# Patient Record
Sex: Female | Born: 1950 | Race: White | Hispanic: Yes | Marital: Married | Smoking: Never smoker
Health system: Southern US, Community
[De-identification: ages and names within clinical notes are randomized; demographics above are authoritative.]

---

## 1998-06-04 ENCOUNTER — Encounter: Admission: RE | Admit: 1998-06-04 | Discharge: 1998-06-04 | Payer: Self-pay | Admitting: Internal Medicine

## 1998-06-18 ENCOUNTER — Encounter: Admission: RE | Admit: 1998-06-18 | Discharge: 1998-06-18 | Payer: Self-pay | Admitting: Internal Medicine

## 1998-07-30 ENCOUNTER — Encounter: Admission: RE | Admit: 1998-07-30 | Discharge: 1998-07-30 | Payer: Self-pay | Admitting: Internal Medicine

## 1998-11-28 ENCOUNTER — Encounter: Payer: Self-pay | Admitting: Emergency Medicine

## 1998-11-28 ENCOUNTER — Encounter: Payer: Self-pay | Admitting: Internal Medicine

## 1998-11-28 ENCOUNTER — Inpatient Hospital Stay (HOSPITAL_COMMUNITY): Admission: EM | Admit: 1998-11-28 | Discharge: 1998-11-29 | Payer: Self-pay | Admitting: Emergency Medicine

## 1999-03-14 ENCOUNTER — Encounter: Admission: RE | Admit: 1999-03-14 | Discharge: 1999-03-14 | Payer: Self-pay | Admitting: Internal Medicine

## 1999-04-10 ENCOUNTER — Emergency Department (HOSPITAL_COMMUNITY): Admission: EM | Admit: 1999-04-10 | Discharge: 1999-04-10 | Payer: Self-pay | Admitting: Emergency Medicine

## 1999-10-11 ENCOUNTER — Emergency Department (HOSPITAL_COMMUNITY): Admission: EM | Admit: 1999-10-11 | Discharge: 1999-10-11 | Payer: Self-pay | Admitting: Emergency Medicine

## 1999-10-11 ENCOUNTER — Encounter: Payer: Self-pay | Admitting: Emergency Medicine

## 1999-10-25 ENCOUNTER — Encounter: Admission: RE | Admit: 1999-10-25 | Discharge: 1999-10-25 | Payer: Self-pay | Admitting: Internal Medicine

## 1999-11-15 ENCOUNTER — Encounter: Admission: RE | Admit: 1999-11-15 | Discharge: 1999-11-15 | Payer: Self-pay | Admitting: Internal Medicine

## 1999-12-18 ENCOUNTER — Encounter: Admission: RE | Admit: 1999-12-18 | Discharge: 1999-12-18 | Payer: Self-pay | Admitting: Internal Medicine

## 1999-12-25 ENCOUNTER — Ambulatory Visit (HOSPITAL_COMMUNITY): Admission: RE | Admit: 1999-12-25 | Discharge: 1999-12-25 | Payer: Self-pay

## 1999-12-25 ENCOUNTER — Encounter: Payer: Self-pay | Admitting: Internal Medicine

## 2000-01-14 ENCOUNTER — Ambulatory Visit (HOSPITAL_COMMUNITY): Admission: RE | Admit: 2000-01-14 | Discharge: 2000-01-14 | Payer: Self-pay | Admitting: Internal Medicine

## 2001-11-15 ENCOUNTER — Encounter: Admission: RE | Admit: 2001-11-15 | Discharge: 2001-11-15 | Payer: Self-pay | Admitting: Internal Medicine

## 2002-09-22 ENCOUNTER — Encounter: Admission: RE | Admit: 2002-09-22 | Discharge: 2002-09-22 | Payer: Self-pay | Admitting: Obstetrics and Gynecology

## 2003-05-04 ENCOUNTER — Encounter: Admission: RE | Admit: 2003-05-04 | Discharge: 2003-05-04 | Payer: Self-pay | Admitting: Internal Medicine

## 2006-07-29 ENCOUNTER — Ambulatory Visit: Payer: Self-pay | Admitting: Family Medicine

## 2006-07-30 ENCOUNTER — Ambulatory Visit: Payer: Self-pay | Admitting: *Deleted

## 2006-08-17 ENCOUNTER — Ambulatory Visit: Payer: Self-pay | Admitting: Family Medicine

## 2006-08-21 ENCOUNTER — Ambulatory Visit (HOSPITAL_COMMUNITY): Admission: RE | Admit: 2006-08-21 | Discharge: 2006-08-21 | Payer: Self-pay | Admitting: Family Medicine

## 2006-12-10 ENCOUNTER — Ambulatory Visit: Payer: Self-pay | Admitting: Internal Medicine

## 2006-12-10 ENCOUNTER — Encounter (INDEPENDENT_AMBULATORY_CARE_PROVIDER_SITE_OTHER): Payer: Self-pay | Admitting: Nurse Practitioner

## 2006-12-10 LAB — CONVERTED CEMR LAB
ALT: 39 units/L — ABNORMAL HIGH (ref 0–35)
Albumin: 4.3 g/dL (ref 3.5–5.2)
BUN: 12 mg/dL (ref 6–23)
CO2: 25 meq/L (ref 19–32)
Chloride: 109 meq/L (ref 96–112)
Cholesterol: 209 mg/dL — ABNORMAL HIGH (ref 0–200)
Glucose, Bld: 112 mg/dL — ABNORMAL HIGH (ref 70–99)
LDL Cholesterol: 123 mg/dL — ABNORMAL HIGH (ref 0–99)
Potassium: 5 meq/L (ref 3.5–5.3)
Total Bilirubin: 0.7 mg/dL (ref 0.3–1.2)
VLDL: 44 mg/dL — ABNORMAL HIGH (ref 0–40)

## 2007-06-04 ENCOUNTER — Ambulatory Visit: Payer: Self-pay | Admitting: Internal Medicine

## 2007-06-04 ENCOUNTER — Encounter (INDEPENDENT_AMBULATORY_CARE_PROVIDER_SITE_OTHER): Payer: Self-pay | Admitting: Nurse Practitioner

## 2007-06-04 LAB — CONVERTED CEMR LAB
AST: 39 units/L — ABNORMAL HIGH (ref 0–37)
Alkaline Phosphatase: 82 units/L (ref 39–117)
BUN: 13 mg/dL (ref 6–23)
Basophils Absolute: 0.1 10*3/uL (ref 0.0–0.1)
Calcium: 9.4 mg/dL (ref 8.4–10.5)
Eosinophils Absolute: 1.8 10*3/uL — ABNORMAL HIGH (ref 0.0–0.7)
Eosinophils Relative: 22 % — ABNORMAL HIGH (ref 0–5)
Glucose, Bld: 128 mg/dL — ABNORMAL HIGH (ref 70–99)
HCT: 46.2 % — ABNORMAL HIGH (ref 36.0–46.0)
Hemoglobin: 15.1 g/dL — ABNORMAL HIGH (ref 12.0–15.0)
MCHC: 32.7 g/dL (ref 30.0–36.0)
Monocytes Absolute: 0.6 10*3/uL (ref 0.1–1.0)
Neutro Abs: 3 10*3/uL (ref 1.7–7.7)
RBC: 5.05 M/uL (ref 3.87–5.11)
Sodium: 140 meq/L (ref 135–145)
Total Bilirubin: 0.3 mg/dL (ref 0.3–1.2)
WBC: 8.5 10*3/uL (ref 4.0–10.5)

## 2007-06-07 ENCOUNTER — Encounter (INDEPENDENT_AMBULATORY_CARE_PROVIDER_SITE_OTHER): Payer: Self-pay | Admitting: Nurse Practitioner

## 2007-06-07 LAB — CONVERTED CEMR LAB
Hep B Core Total Ab: POSITIVE — AB
Hep B S Ab: POSITIVE — AB

## 2007-06-24 ENCOUNTER — Encounter (INDEPENDENT_AMBULATORY_CARE_PROVIDER_SITE_OTHER): Payer: Self-pay | Admitting: Nurse Practitioner

## 2007-06-24 ENCOUNTER — Ambulatory Visit: Payer: Self-pay | Admitting: Internal Medicine

## 2007-06-24 LAB — CONVERTED CEMR LAB
BUN: 10 mg/dL (ref 6–23)
Chloride: 105 meq/L (ref 96–112)
Cholesterol: 217 mg/dL — ABNORMAL HIGH (ref 0–200)
Creatinine, Ser: 0.7 mg/dL (ref 0.40–1.20)
Glucose, Bld: 120 mg/dL — ABNORMAL HIGH (ref 70–99)
HDL: 43 mg/dL (ref >39)
LDL Cholesterol: 138 mg/dL — ABNORMAL HIGH (ref 0–99)
Potassium: 4.3 meq/L (ref 3.5–5.3)
Sodium: 141 meq/L (ref 135–145)
Total Bilirubin: 0.5 mg/dL (ref 0.3–1.2)
Triglycerides: 181 mg/dL — ABNORMAL HIGH (ref <150)
VLDL: 36 mg/dL (ref 0–40)

## 2007-10-06 ENCOUNTER — Encounter: Payer: Self-pay | Admitting: Family Medicine

## 2007-10-06 ENCOUNTER — Ambulatory Visit: Payer: Self-pay | Admitting: Family Medicine

## 2008-01-12 ENCOUNTER — Ambulatory Visit: Payer: Self-pay | Admitting: Internal Medicine

## 2008-01-20 ENCOUNTER — Ambulatory Visit: Payer: Self-pay | Admitting: Internal Medicine

## 2008-01-21 ENCOUNTER — Ambulatory Visit (HOSPITAL_COMMUNITY): Admission: RE | Admit: 2008-01-21 | Discharge: 2008-01-21 | Payer: Self-pay | Admitting: Family Medicine

## 2008-01-24 ENCOUNTER — Ambulatory Visit: Payer: Self-pay | Admitting: Family Medicine

## 2008-01-24 LAB — CONVERTED CEMR LAB
ALT: 48 units/L — ABNORMAL HIGH (ref 0–35)
Albumin: 4.2 g/dL (ref 3.5–5.2)
Basophils Relative: 1 % (ref 0–1)
CO2: 23 meq/L (ref 19–32)
Chloride: 105 meq/L (ref 96–112)
Cholesterol: 124 mg/dL (ref 0–200)
Creatinine, Ser: 0.65 mg/dL (ref 0.40–1.20)
Eosinophils Absolute: 1.3 10*3/uL — ABNORMAL HIGH (ref 0.0–0.7)
Potassium: 4.6 meq/L (ref 3.5–5.3)
RDW: 13.4 % (ref 11.5–15.5)
Sodium: 144 meq/L (ref 135–145)
Total Bilirubin: 0.8 mg/dL (ref 0.3–1.2)
Total Protein: 6.7 g/dL (ref 6.0–8.3)
Triglycerides: 96 mg/dL (ref <150)
VLDL: 19 mg/dL (ref 0–40)

## 2008-05-31 ENCOUNTER — Ambulatory Visit: Payer: Self-pay | Admitting: Internal Medicine

## 2008-11-15 ENCOUNTER — Encounter: Payer: Self-pay | Admitting: Family Medicine

## 2008-11-15 ENCOUNTER — Ambulatory Visit: Payer: Self-pay | Admitting: Internal Medicine

## 2008-12-20 ENCOUNTER — Encounter: Admission: RE | Admit: 2008-12-20 | Discharge: 2009-03-20 | Payer: Self-pay | Admitting: Unknown Physician Specialty

## 2008-12-21 ENCOUNTER — Ambulatory Visit: Payer: Self-pay | Admitting: Internal Medicine

## 2008-12-28 ENCOUNTER — Ambulatory Visit: Payer: Self-pay | Admitting: Internal Medicine

## 2008-12-29 LAB — CONVERTED CEMR LAB
ALT: 25 units/L (ref 0–35)
AST: 20 units/L (ref 0–37)
Albumin: 4.6 g/dL (ref 3.5–5.2)
BUN: 13 mg/dL (ref 6–23)
CO2: 23 meq/L (ref 19–32)
Calcium: 9.7 mg/dL (ref 8.4–10.5)
Chloride: 102 meq/L (ref 96–112)
Creatinine, Ser: 0.72 mg/dL (ref 0.40–1.20)
Helicobacter Pylori Antibody-IgG: 2.8 — ABNORMAL HIGH
Potassium: 4.5 meq/L (ref 3.5–5.3)
Sodium: 140 meq/L (ref 135–145)

## 2009-01-01 ENCOUNTER — Ambulatory Visit (HOSPITAL_COMMUNITY): Admission: RE | Admit: 2009-01-01 | Discharge: 2009-01-01 | Payer: Self-pay | Admitting: Internal Medicine

## 2009-01-02 ENCOUNTER — Telehealth: Payer: Self-pay | Admitting: Internal Medicine

## 2009-01-11 ENCOUNTER — Encounter: Payer: Self-pay | Admitting: Family Medicine

## 2009-01-11 ENCOUNTER — Ambulatory Visit: Payer: Self-pay | Admitting: Internal Medicine

## 2009-01-11 LAB — CONVERTED CEMR LAB
AST: 15 units/L (ref 0–37)
Albumin: 4.3 g/dL (ref 3.5–5.2)
BUN: 10 mg/dL (ref 6–23)
Basophils Absolute: 0 10*3/uL (ref 0.0–0.1)
Chloride: 103 meq/L (ref 96–112)
Cholesterol: 124 mg/dL (ref 0–200)
Eosinophils Relative: 18 % — ABNORMAL HIGH (ref 0–5)
HCT: 45.3 % (ref 36.0–46.0)
HDL: 44 mg/dL (ref >39)
Hemoglobin: 14.1 g/dL (ref 12.0–15.0)
LDL Cholesterol: 60 mg/dL (ref 0–99)
Monocytes Absolute: 0.5 10*3/uL (ref 0.1–1.0)
Platelets: 292 10*3/uL (ref 150–400)
Potassium: 4.6 meq/L (ref 3.5–5.3)
RBC: 4.82 M/uL (ref 3.87–5.11)
Sodium: 141 meq/L (ref 135–145)
Total Bilirubin: 0.6 mg/dL (ref 0.3–1.2)
Total CHOL/HDL Ratio: 2.8
Triglycerides: 99 mg/dL (ref <150)
VLDL: 20 mg/dL (ref 0–40)
WBC: 7.6 10*3/uL (ref 4.0–10.5)

## 2009-02-05 ENCOUNTER — Ambulatory Visit: Payer: Self-pay | Admitting: Internal Medicine

## 2009-05-01 ENCOUNTER — Encounter: Admission: RE | Admit: 2009-05-01 | Discharge: 2009-06-15 | Payer: Self-pay | Admitting: Orthopedic Surgery

## 2009-05-28 ENCOUNTER — Encounter (INDEPENDENT_AMBULATORY_CARE_PROVIDER_SITE_OTHER): Payer: Self-pay | Admitting: Internal Medicine

## 2009-05-28 ENCOUNTER — Other Ambulatory Visit: Admission: RE | Admit: 2009-05-28 | Discharge: 2009-05-28 | Payer: Self-pay | Admitting: Internal Medicine

## 2009-05-28 ENCOUNTER — Ambulatory Visit: Payer: Self-pay | Admitting: Family Medicine

## 2009-06-05 ENCOUNTER — Ambulatory Visit (HOSPITAL_COMMUNITY): Admission: RE | Admit: 2009-06-05 | Discharge: 2009-06-05 | Payer: Self-pay | Admitting: Internal Medicine

## 2009-06-29 ENCOUNTER — Emergency Department (HOSPITAL_COMMUNITY): Admission: EM | Admit: 2009-06-29 | Discharge: 2009-06-30 | Payer: Self-pay | Admitting: Emergency Medicine

## 2009-08-20 ENCOUNTER — Ambulatory Visit: Payer: Self-pay | Admitting: Internal Medicine

## 2009-08-20 LAB — CONVERTED CEMR LAB
BUN: 14 mg/dL (ref 6–23)
CO2: 23 meq/L (ref 19–32)
Chloride: 103 meq/L (ref 96–112)
Cholesterol: 212 mg/dL — ABNORMAL HIGH (ref 0–200)
Creatinine, Ser: 0.61 mg/dL (ref 0.40–1.20)
HDL: 40 mg/dL (ref >39)
LDL Cholesterol: 118 mg/dL — ABNORMAL HIGH (ref 0–99)
Potassium: 4.5 meq/L (ref 3.5–5.3)
VLDL: 54 mg/dL — ABNORMAL HIGH (ref 0–40)

## 2009-08-27 ENCOUNTER — Ambulatory Visit: Payer: Self-pay | Admitting: Internal Medicine

## 2009-10-31 ENCOUNTER — Ambulatory Visit: Payer: Self-pay | Admitting: Internal Medicine

## 2009-12-14 ENCOUNTER — Ambulatory Visit: Payer: Self-pay | Admitting: Internal Medicine

## 2009-12-14 LAB — CONVERTED CEMR LAB
Hgb A1c MFr Bld: 8.8 % — ABNORMAL HIGH (ref <5.7)
LDL Cholesterol: 70 mg/dL (ref 0–99)
Total CHOL/HDL Ratio: 3.3
Triglycerides: 128 mg/dL (ref <150)
VLDL: 26 mg/dL (ref 0–40)

## 2010-06-20 ENCOUNTER — Encounter: Payer: Self-pay | Admitting: Internal Medicine

## 2010-06-20 LAB — CONVERTED CEMR LAB
ALT: 68 units/L — ABNORMAL HIGH (ref 0–35)
AST: 52 units/L — ABNORMAL HIGH (ref 0–37)
BUN: 12 mg/dL (ref 6–23)
Calcium: 10 mg/dL (ref 8.4–10.5)
Chloride: 100 meq/L (ref 96–112)
Microalb, Ur: 0.5 mg/dL (ref 0.00–1.89)
Sodium: 139 meq/L (ref 135–145)
Total CHOL/HDL Ratio: 4.5
Total Protein: 7.4 g/dL (ref 6.0–8.3)

## 2010-07-08 LAB — CBC
HCT: 39.8 % (ref 36.0–46.0)
MCHC: 34.4 g/dL (ref 30.0–36.0)
MCV: 92.6 fL (ref 78.0–100.0)
Platelets: 198 10*3/uL (ref 150–400)
RDW: 13 % (ref 11.5–15.5)
WBC: 8.4 10*3/uL (ref 4.0–10.5)

## 2010-07-08 LAB — POCT I-STAT, CHEM 8
Chloride: 110 mEq/L (ref 96–112)
Potassium: 4.4 mEq/L (ref 3.5–5.1)
Sodium: 138 mEq/L (ref 135–145)

## 2010-07-08 LAB — DIFFERENTIAL
Basophils Relative: 1 % (ref 0–1)
Eosinophils Absolute: 2 10*3/uL — ABNORMAL HIGH (ref 0.0–0.7)
Lymphocytes Relative: 34 % (ref 12–46)

## 2010-07-08 LAB — BASIC METABOLIC PANEL
Calcium: 9.5 mg/dL (ref 8.4–10.5)
Chloride: 107 mEq/L (ref 96–112)
Creatinine, Ser: 0.62 mg/dL (ref 0.4–1.2)
GFR calc Af Amer: >60 mL/min (ref >60)

## 2010-07-08 LAB — POCT CARDIAC MARKERS: Troponin i, poc: <0.05 ng/mL (ref 0.00–0.09)

## 2010-08-30 ENCOUNTER — Other Ambulatory Visit (HOSPITAL_COMMUNITY): Payer: Self-pay | Admitting: Family Medicine

## 2010-08-30 DIAGNOSIS — Z1231 Encounter for screening mammogram for malignant neoplasm of breast: Secondary | ICD-10-CM

## 2010-09-12 ENCOUNTER — Ambulatory Visit (HOSPITAL_COMMUNITY)
Admission: RE | Admit: 2010-09-12 | Discharge: 2010-09-12 | Disposition: A | Discharge status: Home / Self Care | Payer: Medicaid Other | Source: Ambulatory Visit | Attending: Family Medicine | Admitting: Family Medicine

## 2010-09-12 DIAGNOSIS — Z1231 Encounter for screening mammogram for malignant neoplasm of breast: Secondary | ICD-10-CM | POA: Insufficient documentation

## 2011-09-05 ENCOUNTER — Other Ambulatory Visit (HOSPITAL_COMMUNITY): Payer: Self-pay | Admitting: Family Medicine

## 2011-09-05 DIAGNOSIS — Z1231 Encounter for screening mammogram for malignant neoplasm of breast: Secondary | ICD-10-CM

## 2011-10-22 ENCOUNTER — Ambulatory Visit (HOSPITAL_COMMUNITY)
Admission: RE | Admit: 2011-10-22 | Discharge: 2011-10-22 | Disposition: A | Discharge status: Home / Self Care | Payer: Medicaid Other | Source: Ambulatory Visit | Attending: Family Medicine | Admitting: Family Medicine

## 2011-10-22 DIAGNOSIS — Z1231 Encounter for screening mammogram for malignant neoplasm of breast: Secondary | ICD-10-CM | POA: Insufficient documentation

## 2012-09-20 ENCOUNTER — Other Ambulatory Visit (HOSPITAL_COMMUNITY): Payer: Self-pay | Admitting: Specialist

## 2012-09-20 DIAGNOSIS — Z1231 Encounter for screening mammogram for malignant neoplasm of breast: Secondary | ICD-10-CM

## 2012-10-26 ENCOUNTER — Ambulatory Visit (HOSPITAL_COMMUNITY)
Admission: RE | Admit: 2012-10-26 | Discharge: 2012-10-26 | Disposition: A | Discharge status: Home / Self Care | Payer: Medicaid Other | Source: Ambulatory Visit | Attending: Specialist | Admitting: Specialist

## 2012-10-26 DIAGNOSIS — Z1231 Encounter for screening mammogram for malignant neoplasm of breast: Secondary | ICD-10-CM

## 2013-11-10 ENCOUNTER — Other Ambulatory Visit (HOSPITAL_COMMUNITY): Payer: Self-pay | Admitting: Specialist

## 2013-11-10 DIAGNOSIS — Z1231 Encounter for screening mammogram for malignant neoplasm of breast: Secondary | ICD-10-CM

## 2013-11-17 ENCOUNTER — Ambulatory Visit (HOSPITAL_COMMUNITY)
Admission: RE | Admit: 2013-11-17 | Discharge: 2013-11-17 | Disposition: A | Discharge status: Home / Self Care | Payer: Medicaid Other | Source: Ambulatory Visit | Attending: Specialist | Admitting: Specialist

## 2013-11-17 DIAGNOSIS — Z1231 Encounter for screening mammogram for malignant neoplasm of breast: Secondary | ICD-10-CM | POA: Diagnosis present

## 2013-12-20 ENCOUNTER — Emergency Department (HOSPITAL_COMMUNITY)
Admission: EM | Admit: 2013-12-20 | Discharge: 2013-12-20 | Disposition: A | Discharge status: Home / Self Care | Payer: Medicaid Other | Attending: Emergency Medicine | Admitting: Emergency Medicine

## 2013-12-20 ENCOUNTER — Emergency Department (HOSPITAL_COMMUNITY): Payer: Medicaid Other

## 2013-12-20 ENCOUNTER — Encounter (HOSPITAL_COMMUNITY): Payer: Self-pay | Admitting: Emergency Medicine

## 2013-12-20 DIAGNOSIS — S46909A Unspecified injury of unspecified muscle, fascia and tendon at shoulder and upper arm level, unspecified arm, initial encounter: Secondary | ICD-10-CM | POA: Diagnosis not present

## 2013-12-20 DIAGNOSIS — S8990XA Unspecified injury of unspecified lower leg, initial encounter: Secondary | ICD-10-CM | POA: Insufficient documentation

## 2013-12-20 DIAGNOSIS — Y9289 Other specified places as the place of occurrence of the external cause: Secondary | ICD-10-CM | POA: Diagnosis not present

## 2013-12-20 DIAGNOSIS — S99919A Unspecified injury of unspecified ankle, initial encounter: Secondary | ICD-10-CM | POA: Diagnosis not present

## 2013-12-20 DIAGNOSIS — W010XXA Fall on same level from slipping, tripping and stumbling without subsequent striking against object, initial encounter: Secondary | ICD-10-CM | POA: Insufficient documentation

## 2013-12-20 DIAGNOSIS — S199XXA Unspecified injury of neck, initial encounter: Secondary | ICD-10-CM

## 2013-12-20 DIAGNOSIS — S4980XA Other specified injuries of shoulder and upper arm, unspecified arm, initial encounter: Secondary | ICD-10-CM | POA: Insufficient documentation

## 2013-12-20 DIAGNOSIS — W19XXXA Unspecified fall, initial encounter: Secondary | ICD-10-CM

## 2013-12-20 DIAGNOSIS — S0993XA Unspecified injury of face, initial encounter: Secondary | ICD-10-CM | POA: Diagnosis not present

## 2013-12-20 DIAGNOSIS — Y93E1 Activity, personal bathing and showering: Secondary | ICD-10-CM | POA: Insufficient documentation

## 2013-12-20 DIAGNOSIS — S7010XA Contusion of unspecified thigh, initial encounter: Secondary | ICD-10-CM | POA: Insufficient documentation

## 2013-12-20 DIAGNOSIS — S99929A Unspecified injury of unspecified foot, initial encounter: Principal | ICD-10-CM

## 2013-12-20 DIAGNOSIS — S8991XA Unspecified injury of right lower leg, initial encounter: Secondary | ICD-10-CM

## 2013-12-20 DIAGNOSIS — S4992XA Unspecified injury of left shoulder and upper arm, initial encounter: Secondary | ICD-10-CM

## 2013-12-20 MED ORDER — TRAMADOL HCL 50 MG PO TABS: 50.0000 mg | ORAL_TABLET | Freq: Four times a day (QID) | ORAL | Status: DC | PRN

## 2013-12-20 NOTE — Discharge Instructions (Signed)
Take Tramadol for pain. Refer to attached documents for more information.   Tome Tramadol para Chief Technology Officer . Consulte los documentos adjuntos para ms informacin.

## 2013-12-20 NOTE — ED Provider Notes (Signed)
CSN: [id number]     Arrival date & time 12/20/13  0831 History  This chart was scribed for non-physician practitioner Emilia Beck, PA-C, working with Flint Melter, MD, by Yevette Edwards, ED Scribe. This patient was seen in room TR08C/TR08C and the patient's care was started at 9:06 AM.   First MD Initiated Contact with Patient 12/20/13 [phone]     Chief Complaint  Patient presents with  . Fall    The history is provided by the patient. A language interpreter was used ([place]).   HPI Comments: Gabrielle Craig is a 63 y.o. female who presents to the Emergency Department complaining of a mechanical fall which occurred two days ago when she slipped in the shower. Gabrielle Craig reports she hit her forehead against the tub, but she denies LOC. She also reports she hit her left lateral thigh which is associated with a circular bruise that is painful with pressure. Gabrielle Craig also complains of her pain to her right knee which is worsened when she straightens her leg. Additionally, she complains of pain to her right arm and shoulder. She denies abdominal pain. She does not take blood thinners.   No past medical history on file. No past surgical history on file. No family history on file. History  Substance Use Topics  . Smoking status: Never Smoker   . Smokeless tobacco: Not on file  . Alcohol Use: No   No OB history provided.  Review of Systems  Constitutional: Negative for fever and chills.  Musculoskeletal: Positive for myalgias.  Neurological: Negative for syncope.  All other systems reviewed and are negative. \  Allergies  Review of patient's allergies indicates no known allergies.  Home Medications   Prior to Admission medications   Not on File   Triage Vitals: BP 121/61  Pulse 83  Temp(Src) 97.9 F (36.6 C) (Oral)  Resp 18  SpO2 95%  Physical Exam  Nursing note and vitals reviewed. Constitutional: She is oriented to person, place, and time. She appears well-developed and  well-nourished. No distress.  HENT:  Head: Normocephalic and atraumatic.  Left anterior forehead tenderness to palpation without hematoma or skull deformity.   Eyes: Conjunctivae and EOM are normal.  Neck: Neck supple. No tracheal deviation present.  Cardiovascular: Normal rate.   Pulmonary/Chest: Effort normal. No respiratory distress. She has no wheezes. She has no rales. She exhibits no tenderness.  No anterior or posterior chest tenderness to palpation.   Abdominal: Soft. She exhibits no distension. There is no tenderness. There is no rebound.  Musculoskeletal: Normal range of motion.  No midline spine tenderness to palpation. No paraspinal tenderness to palpation. No bruising noted on back.   Right anterior knee tenderness to palpation without bruising or deformity. Mild generalized edema. Slightly limited extension of right knee due to pain.   Left posterior shoulder tenderness to palpation over acromion process. No obvious deformity. Full ROM.   Large hematoma of left lateral thigh near hip with tenderness to palpation.   Neurological: She is alert and oriented to person, place, and time.  Lower extremity strength and sensation equal and intact bilaterally.   Skin: Skin is warm and dry.  Large hematoma of left lateral thigh.   Psychiatric: She has a normal mood and affect. Her behavior is normal.    ED Course  Procedures (including critical care time)  DIAGNOSTIC STUDIES: Oxygen Saturation is 95% on room air, adequate by my interpretation.    COORDINATION OF CARE:  9:15 AM- Discussed  treatment plan with patient, and the patient agreed to the plan. The plan includes imaging and pain medication.   Labs Review Labs Reviewed - No data to display  Imaging Review Dg Hip Complete Left  12/20/2013   CLINICAL DATA:  Left hip pain following injury  EXAM: LEFT HIP - COMPLETE 2+ VIEW  COMPARISON:  None.  FINDINGS: Mild degenerative changes are noted of the hip joints bilaterally. No  acute fracture dislocation is seen. No gross soft tissue abnormality is noted.  IMPRESSION: Mild degenerative change without acute abnormality.   Electronically Signed   By: Alcide Clever M.D.   On: 12/20/2013 09:57   Ct Head Wo Contrast  12/20/2013   CLINICAL DATA:  Status post fall 2 days ago with a blow to the left forehead. Hematoma. Neck pain.  EXAM: CT HEAD WITHOUT CONTRAST  CT CERVICAL SPINE WITHOUT CONTRAST  TECHNIQUE: Multidetector CT imaging of the head and cervical spine was performed following the standard protocol without intravenous contrast. Multiplanar CT image reconstructions of the cervical spine were also generated.  COMPARISON:  None.  FINDINGS: CT HEAD FINDINGS  There is no evidence of acute intracranial abnormality including infarct, hemorrhage, mass lesion, mass effect, midline shift or abnormal extra-axial fluid collection. No hydrocephalus or pneumocephalus. The calvarium is intact. Mild mucosal thickening ethmoid air cells is noted.  CT CERVICAL SPINE FINDINGS  There is no fracture or malalignment of the cervical spine. Disc bulging and endplate spurring are identified C5-6 and C6-7. Lung apices are clear.  IMPRESSION: No acute finding head or cervical spine.   Electronically Signed   By: Drusilla Kanner M.D.   On: 12/20/2013 10:14   Ct Cervical Spine Wo Contrast  12/20/2013   CLINICAL DATA:  Status post fall 2 days ago with a blow to the left forehead. Hematoma. Neck pain.  EXAM: CT HEAD WITHOUT CONTRAST  CT CERVICAL SPINE WITHOUT CONTRAST  TECHNIQUE: Multidetector CT imaging of the head and cervical spine was performed following the standard protocol without intravenous contrast. Multiplanar CT image reconstructions of the cervical spine were also generated.  COMPARISON:  None.  FINDINGS: CT HEAD FINDINGS  There is no evidence of acute intracranial abnormality including infarct, hemorrhage, mass lesion, mass effect, midline shift or abnormal extra-axial fluid collection. No  hydrocephalus or pneumocephalus. The calvarium is intact. Mild mucosal thickening ethmoid air cells is noted.  CT CERVICAL SPINE FINDINGS  There is no fracture or malalignment of the cervical spine. Disc bulging and endplate spurring are identified C5-6 and C6-7. Lung apices are clear.  IMPRESSION: No acute finding head or cervical spine.   Electronically Signed   By: Drusilla Kanner M.D.   On: 12/20/2013 10:14   Dg Shoulder Left  12/20/2013   CLINICAL DATA:  Left shoulder pain  EXAM: LEFT SHOULDER - 2+ VIEW  COMPARISON:  None.  FINDINGS: There is no evidence of fracture or dislocation. There is no evidence of arthropathy or other focal bone abnormality. Soft tissues are unremarkable.  IMPRESSION: No acute abnormality noted.   Electronically Signed   By: Alcide Clever M.D.   On: 12/20/2013 09:56   Dg Knee Complete 4 Views Right  12/20/2013   CLINICAL DATA:  Trauma and pain.  EXAM: RIGHT KNEE - COMPLETE 4+ VIEW  COMPARISON:  None.  FINDINGS: Moderate medial and lateral compartment joint space narrowing and osteophyte formation. Small intra-articular loose body projecting adjacent the lateral tibial spine. No acute fracture or dislocation. Moderate to marked patellofemoral joint space narrowing  and osteophyte formation. No joint effusion.  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   Electronically Signed   By: Jeronimo Greaves M.D.   On: 12/20/2013 10:00     EKG Interpretation None      MDM   Final diagnoses:  Fall, initial encounter  Right knee injury, initial encounter  Shoulder injury, left, initial encounter    10:21 AM Patient's xrays and CT head unremarkable for acute changes. Patient will have tramadol for pain. Vitals stable and patient afebrile. No further evaluation needed at this time.   I personally performed the services described in this documentation, which was scribed in my presence. The recorded information has been reviewed and is  accurate.    Emilia Beck, [place] 12/21/13 2145

## 2013-12-20 NOTE — ED Notes (Signed)
Patient states mechanical fall on Sunday.  Patient states she hit L forehead, R knee and L upper leg.  L upper leg purple bruising.   Patient R knee swollen. Small hematoma to L forehead.

## 2013-12-22 NOTE — ED Provider Notes (Signed)
Medical screening examination/treatment/procedure(s) were performed by non-physician practitioner and as supervising physician I was immediately available for consultation/collaboration.   EKG Interpretation None       Kohei Antonellis L Brylen Wagar, MD 12/22/13 1419 

## 2014-08-29 ENCOUNTER — Encounter: Payer: Self-pay | Admitting: Internal Medicine

## 2014-10-17 ENCOUNTER — Encounter: Payer: Medicaid Other | Admitting: Internal Medicine

## 2014-12-06 ENCOUNTER — Other Ambulatory Visit (HOSPITAL_COMMUNITY): Payer: Self-pay | Admitting: Specialist

## 2014-12-06 DIAGNOSIS — Z1231 Encounter for screening mammogram for malignant neoplasm of breast: Secondary | ICD-10-CM

## 2014-12-12 ENCOUNTER — Ambulatory Visit (HOSPITAL_COMMUNITY)
Admission: RE | Admit: 2014-12-12 | Discharge: 2014-12-12 | Disposition: A | Discharge status: Home / Self Care | Payer: Medicare Other | Source: Ambulatory Visit | Attending: Specialist | Admitting: Specialist

## 2014-12-12 DIAGNOSIS — Z1231 Encounter for screening mammogram for malignant neoplasm of breast: Secondary | ICD-10-CM | POA: Diagnosis present

## 2014-12-14 ENCOUNTER — Encounter: Payer: Medicaid Other | Admitting: Internal Medicine

## 2016-01-30 ENCOUNTER — Other Ambulatory Visit: Payer: Self-pay | Admitting: Specialist

## 2016-01-30 ENCOUNTER — Other Ambulatory Visit: Payer: Self-pay | Admitting: Internal Medicine

## 2016-01-30 DIAGNOSIS — Z1231 Encounter for screening mammogram for malignant neoplasm of breast: Secondary | ICD-10-CM

## 2016-02-22 ENCOUNTER — Ambulatory Visit: Payer: Medicare Other

## 2016-03-28 ENCOUNTER — Other Ambulatory Visit: Payer: Self-pay | Admitting: Internal Medicine

## 2016-03-28 ENCOUNTER — Ambulatory Visit
Admission: RE | Admit: 2016-03-28 | Discharge: 2016-03-28 | Disposition: A | Discharge status: Home / Self Care | Payer: Medicare Other | Source: Ambulatory Visit | Attending: Internal Medicine | Admitting: Internal Medicine

## 2016-03-28 DIAGNOSIS — Z1231 Encounter for screening mammogram for malignant neoplasm of breast: Secondary | ICD-10-CM

## 2016-03-28 DIAGNOSIS — R5381 Other malaise: Secondary | ICD-10-CM

## 2017-02-19 ENCOUNTER — Other Ambulatory Visit: Payer: Self-pay | Admitting: Internal Medicine

## 2017-02-19 DIAGNOSIS — Z139 Encounter for screening, unspecified: Secondary | ICD-10-CM

## 2017-03-30 ENCOUNTER — Ambulatory Visit
Admission: RE | Admit: 2017-03-30 | Discharge: 2017-03-30 | Disposition: A | Discharge status: Home / Self Care | Payer: Medicare Other | Source: Ambulatory Visit | Attending: Internal Medicine | Admitting: Internal Medicine

## 2017-03-30 DIAGNOSIS — Z139 Encounter for screening, unspecified: Secondary | ICD-10-CM

## 2017-04-01 ENCOUNTER — Other Ambulatory Visit: Payer: Self-pay | Admitting: Internal Medicine

## 2017-04-01 DIAGNOSIS — R928 Other abnormal and inconclusive findings on diagnostic imaging of breast: Secondary | ICD-10-CM

## 2017-04-22 ENCOUNTER — Ambulatory Visit
Admission: RE | Admit: 2017-04-22 | Discharge: 2017-04-22 | Disposition: A | Discharge status: Home / Self Care | Payer: Medicare Other | Source: Ambulatory Visit | Attending: Internal Medicine | Admitting: Internal Medicine

## 2017-04-22 DIAGNOSIS — R928 Other abnormal and inconclusive findings on diagnostic imaging of breast: Secondary | ICD-10-CM

## 2018-01-12 ENCOUNTER — Other Ambulatory Visit: Payer: Self-pay | Admitting: Internal Medicine

## 2018-01-12 DIAGNOSIS — R5381 Other malaise: Secondary | ICD-10-CM

## 2018-01-18 ENCOUNTER — Other Ambulatory Visit: Payer: Self-pay | Admitting: Internal Medicine

## 2018-01-18 DIAGNOSIS — E2839 Other primary ovarian failure: Secondary | ICD-10-CM

## 2018-01-21 ENCOUNTER — Emergency Department (HOSPITAL_COMMUNITY)
Admission: EM | Admit: 2018-01-21 | Discharge: 2018-01-22 | Disposition: A | Discharge status: Home / Self Care | Payer: Medicare Other | Attending: Emergency Medicine | Admitting: Emergency Medicine

## 2018-01-21 ENCOUNTER — Encounter (HOSPITAL_COMMUNITY): Payer: Self-pay

## 2018-01-21 DIAGNOSIS — R1013 Epigastric pain: Secondary | ICD-10-CM

## 2018-01-21 DIAGNOSIS — Z79899 Other long term (current) drug therapy: Secondary | ICD-10-CM | POA: Diagnosis not present

## 2018-01-21 DIAGNOSIS — R1011 Right upper quadrant pain: Secondary | ICD-10-CM

## 2018-01-21 DIAGNOSIS — N3000 Acute cystitis without hematuria: Secondary | ICD-10-CM | POA: Diagnosis not present

## 2018-01-21 LAB — LIPASE, BLOOD: Lipase: 33 U/L (ref 11–51)

## 2018-01-21 LAB — URINALYSIS, ROUTINE W REFLEX MICROSCOPIC
Bilirubin Urine: NEGATIVE
KETONES UR: NEGATIVE mg/dL
Nitrite: NEGATIVE
PH: 6 (ref 5.0–8.0)
Protein, ur: NEGATIVE mg/dL
Specific Gravity, Urine: 1.017 (ref 1.005–1.030)
WBC, UA: >50 WBC/hpf — ABNORMAL HIGH (ref 0–5)

## 2018-01-21 LAB — CBC
HEMATOCRIT: 44.5 % (ref 36.0–46.0)
HEMOGLOBIN: 14.3 g/dL (ref 12.0–15.0)
MCH: 29.4 pg (ref 26.0–34.0)
MCHC: 32.1 g/dL (ref 30.0–36.0)
MCV: 91.6 fL (ref 80.0–100.0)
PLATELETS: 268 10*3/uL (ref 150–400)
RBC: 4.86 MIL/uL (ref 3.87–5.11)
RDW: 11.9 % (ref 11.5–15.5)
WBC: 11 10*3/uL — AB (ref 4.0–10.5)
nRBC: 0 % (ref 0.0–0.2)

## 2018-01-21 LAB — COMPREHENSIVE METABOLIC PANEL WITH GFR
ALT: 35 U/L (ref 0–44)
AST: 27 U/L (ref 15–41)
Albumin: 3.9 g/dL (ref 3.5–5.0)
Alkaline Phosphatase: 75 U/L (ref 38–126)
Anion gap: 10 (ref 5–15)
BUN: 12 mg/dL (ref 8–23)
CO2: 23 mmol/L (ref 22–32)
Calcium: 9.6 mg/dL (ref 8.9–10.3)
Chloride: 103 mmol/L (ref 98–111)
Creatinine, Ser: 0.74 mg/dL (ref 0.44–1.00)
GFR calc Af Amer: >60 mL/min
GFR calc non Af Amer: >60 mL/min
Glucose, Bld: 272 mg/dL — ABNORMAL HIGH (ref 70–99)
Potassium: 4.4 mmol/L (ref 3.5–5.1)
Sodium: 136 mmol/L (ref 135–145)
Total Bilirubin: 0.6 mg/dL (ref 0.3–1.2)
Total Protein: 7 g/dL (ref 6.5–8.1)

## 2018-01-21 NOTE — ED Triage Notes (Signed)
Pt states that since Monday she has been having upper abd pain without n/v/d. Pain is worse after eating. Pt was placed on antibiotics for a UTI and that when the pain started. Denies urinary symptoms now

## 2018-01-22 ENCOUNTER — Emergency Department (HOSPITAL_COMMUNITY): Payer: Medicare Other

## 2018-01-22 DIAGNOSIS — N3000 Acute cystitis without hematuria: Secondary | ICD-10-CM | POA: Diagnosis not present

## 2018-01-22 MED ORDER — RANITIDINE HCL 150 MG PO TABS: 150.0000 mg | ORAL_TABLET | Freq: Two times a day (BID) | ORAL | 0 refills | Status: AC

## 2018-01-22 MED ORDER — CIPROFLOXACIN HCL 500 MG PO TABS: 500.0000 mg | ORAL_TABLET | Freq: Two times a day (BID) | ORAL | 0 refills | Status: AC

## 2018-01-22 MED ORDER — ONDANSETRON 4 MG PO TBDP
8.0000 mg | ORAL_TABLET | Freq: Once | ORAL | Status: AC
  Administered 2018-01-22: 8 mg via ORAL
  Filled 2018-01-22: qty 2

## 2018-01-22 MED ORDER — HYDROCODONE-ACETAMINOPHEN 5-325 MG PO TABS
1.0000 | ORAL_TABLET | Freq: Once | ORAL | Status: AC
  Administered 2018-01-22: 1 via ORAL
  Filled 2018-01-22: qty 1

## 2018-01-22 NOTE — ED Notes (Signed)
E-SIGNATURE NOT AVAILABLE, PT VERBALIZED UNDERSTANDING OF DC INSTRUCTIONS AND PRESCRIPTIONS.  

## 2018-01-22 NOTE — ED Notes (Signed)
Pt transported to US

## 2018-01-22 NOTE — ED Notes (Signed)
EDP AT BEDSIDE

## 2018-01-22 NOTE — ED Provider Notes (Signed)
[facility] EMERGENCY DEPARTMENT Provider Note   CSN: [id number] Arrival date & time: 01/21/18  2211     History   Chief Complaint Chief Complaint  Patient presents with  . Abdominal Pain    HPI Gabrielle Craig is a 67 y.o. female.  The history is provided by the patient. A language interpreter was used [phone]).  Abdominal Pain   This is a new problem. The current episode started more than 2 days ago. The problem occurs daily. The pain is associated with eating. The pain is located in the epigastric region. Quality: stabbing pain. The pain is moderate. Pertinent negatives include fever, diarrhea, hematochezia, vomiting and dysuria. The symptoms are aggravated by eating. Nothing relieves the symptoms.  pt reports recent UTI and placed on antibiotic- keflex and then augmentin and has completed course of ABX No h/o abd surgery   PMH-diabetes OB History   None      Home Medications    Prior to Admission medications   Medication Sig Start Date End Date Taking? Authorizing Provider  diclofenac (VOLTAREN) 75 MG EC tablet Take 75 mg by mouth 2 (two) times daily.    [provider]  fluticasone (FLONASE) 50 MCG/ACT nasal spray Place 1 spray into both nostrils daily.    [provider]  gabapentin (NEURONTIN) 100 MG capsule Take 100 mg by mouth 2 (two) times daily.    [provider]  glipiZIDE (GLUCOTROL) 10 MG tablet Take 10 mg by mouth daily before breakfast.    [provider]  ibuprofen (ADVIL,MOTRIN) 200 MG tablet Take 200 mg by mouth daily as needed for moderate pain.    [provider]  insulin aspart (NOVOLOG) 100 UNIT/ML injection Inject 10 Units into the skin 3 (three) times daily before meals.    [provider]  insulin glargine (LANTUS) 100 UNIT/ML injection Inject 40 Units into the skin at bedtime.    [provider]  lisinopril (PRINIVIL,ZESTRIL) 10 MG tablet Take 10 mg by mouth  daily.    [provider]  metFORMIN (GLUCOPHAGE) 1000 MG tablet Take 1,000 mg by mouth 2 (two) times daily with a meal.    [provider]  pravastatin (PRAVACHOL) 40 MG tablet Take 40 mg by mouth daily.    [provider]  traMADol (ULTRAM) 50 MG tablet Take 1 tablet (50 mg total) by mouth every 6 (six) hours as needed. 12/20/13   Emilia Beck, PA-C    Family History No family history on file.  Social History Social History   Tobacco Use  . Smoking status: Never Smoker  . Smokeless tobacco: Never Used  Substance Use Topics  . Alcohol use: No  . Drug use: No     Allergies   Patient has no known allergies.   Review of Systems Review of Systems  Constitutional: Negative for fever.  Respiratory: Negative for shortness of breath.   Cardiovascular: Negative for chest pain.  Gastrointestinal: Positive for abdominal pain. Negative for blood in stool, diarrhea, hematochezia and vomiting.  Genitourinary: Negative for dysuria.  All other systems reviewed and are negative.    Physical Exam Updated Vital Signs BP 119/64 (BP Location: Right Arm)   Pulse 72   Temp 98.1 F (36.7 C) (Oral)   Resp 18   SpO2 93%   Physical Exam CONSTITUTIONAL: Well developed/well nourished HEAD: Normocephalic/atraumatic EYES: EOMI/PERRL, no icterus ENMT: Mucous membranes moist NECK: supple no meningeal signs SPINE/BACK:entire spine nontender CV: S1/S2 noted, no murmurs/rubs/gallops noted  LUNGS: Lungs are clear to auscultation bilaterally, no apparent distress ABDOMEN: soft, moderate RUQ/epigastric tenderness, no rebound or guarding, bowel sounds noted throughout abdomen GU:no cva tenderness NEURO: Pt is awake/alert/appropriate, moves all extremitiesx4.  No facial droop.   EXTREMITIES: pulses normal/equal, full ROM SKIN: warm, color normal PSYCH: no abnormalities of mood noted, alert and oriented to situation   ED Treatments / Results  Labs (all labs  ordered are listed, but only abnormal results are displayed) Labs Reviewed  COMPREHENSIVE METABOLIC PANEL - Abnormal; Notable for the following components:      Result Value   Glucose, Bld 272 (*)    All other components within normal limits  CBC - Abnormal; Notable for the following components:   WBC 11.0 (*)    All other components within normal limits  URINALYSIS, ROUTINE W REFLEX MICROSCOPIC - Abnormal; Notable for the following components:   Color, Urine STRAW (*)    APPearance HAZY (*)    Glucose, UA >=500 (*)    Hgb urine dipstick SMALL (*)    Leukocytes, UA LARGE (*)    WBC, UA >50 (*)    Bacteria, UA RARE (*)    All other components within normal limits  LIPASE, BLOOD    EKG EKG Interpretation  Date/Time:  Friday January 22 2018 03:41:48 EDT Ventricular Rate:  71 PR Interval:    QRS Duration: 82 QT Interval:  402 QTC Calculation: 437 R Axis:   -38 Text Interpretation:  Sinus rhythm Left axis deviation Low voltage, precordial leads Probable anterolateral infarct, old Baseline wander in lead(s) V4 No significant change since last tracing Confirmed by Zadie Rhine ([id number]) on 01/22/2018 4:07:28 AM   Radiology US Abdomen Limited Ruq  Result Date: 01/22/2018 CLINICAL DATA:  Right upper quadrant pain EXAM: ULTRASOUND ABDOMEN LIMITED RIGHT UPPER QUADRANT COMPARISON:  None. FINDINGS: Gallbladder: No gallstones or wall thickening visualized. No sonographic Murphy sign noted by sonographer. Common bile duct: Diameter: 5.6 mm Liver: Increased echogenicity of the liver suggests hepatic steatosis. Portal vein is patent on color Doppler imaging with normal direction of blood flow towards the liver. IMPRESSION: No cholelithiasis or other evidence of acute cholecystitis. Electronically Signed   By: Deatra Robinson M.D.   On: 01/22/2018 05:17    Procedures Procedures    Medications Ordered in ED Medications  ondansetron (ZOFRAN-ODT) disintegrating tablet 8 mg (8 mg Oral Given  01/22/18 0338)  HYDROcodone-acetaminophen (NORCO/VICODIN) 5-325 MG per tablet 1 tablet (1 tablet Oral Given 01/22/18 9147)     Initial Impression / Assessment and Plan / ED Course  I have reviewed the triage vital signs and the nursing notes.  Pertinent labs results that were available during my care of the patient were reviewed by me and considered in my medical decision making (see chart for details).     Pt will need RUQ [place] Will also need tx for UTI   6:39 AM No acute findings on imaging.  Patient improved.  She was sleeping on reassessment. He is Artie failed outpatient therapy with Augmentin and Keflex for UTI.  Therefore I will need to use Cipro.  I discussed that she will need to follow her glucose as cipro can lower glucose with her DM meds  No other findings of acute abdominal process.  Will discharge with PCP follow-up Final Clinical Impressions(s) / ED Diagnoses   Final diagnoses:  RUQ pain  Epigastric pain  Acute cystitis without hematuria    ED Discharge Orders  Ordered    ciprofloxacin (CIPRO) 500 MG tablet  2 times daily     01/22/18 0624    ranitidine (ZANTAC) 150 MG tablet  2 times daily     01/22/18 2956           Zadie Rhine, MD 01/22/18 [phone]

## 2018-02-19 ENCOUNTER — Ambulatory Visit
Admission: RE | Admit: 2018-02-19 | Discharge: 2018-02-19 | Disposition: A | Discharge status: Home / Self Care | Payer: Medicare Other | Source: Ambulatory Visit | Attending: Internal Medicine | Admitting: Internal Medicine

## 2018-02-19 DIAGNOSIS — E2839 Other primary ovarian failure: Secondary | ICD-10-CM

## 2018-05-07 ENCOUNTER — Other Ambulatory Visit: Payer: Self-pay | Admitting: Internal Medicine

## 2018-05-07 DIAGNOSIS — Z1231 Encounter for screening mammogram for malignant neoplasm of breast: Secondary | ICD-10-CM

## 2018-09-15 ENCOUNTER — Ambulatory Visit: Payer: Medicare Other

## 2018-11-01 ENCOUNTER — Other Ambulatory Visit: Payer: Self-pay

## 2018-11-01 ENCOUNTER — Ambulatory Visit
Admission: RE | Admit: 2018-11-01 | Discharge: 2018-11-01 | Disposition: A | Discharge status: Home / Self Care | Payer: Medicare Other | Source: Ambulatory Visit | Attending: Internal Medicine | Admitting: Internal Medicine

## 2018-11-01 DIAGNOSIS — Z1231 Encounter for screening mammogram for malignant neoplasm of breast: Secondary | ICD-10-CM

## 2019-01-25 ENCOUNTER — Other Ambulatory Visit: Payer: Self-pay | Admitting: Internal Medicine

## 2019-01-25 ENCOUNTER — Ambulatory Visit
Admission: RE | Admit: 2019-01-25 | Discharge: 2019-01-25 | Disposition: A | Discharge status: Home / Self Care | Payer: Medicare Other | Source: Ambulatory Visit | Attending: Internal Medicine | Admitting: Internal Medicine

## 2019-01-25 ENCOUNTER — Other Ambulatory Visit: Payer: Self-pay

## 2019-01-25 DIAGNOSIS — R059 Cough, unspecified: Secondary | ICD-10-CM

## 2019-01-25 DIAGNOSIS — R05 Cough: Secondary | ICD-10-CM

## 2019-09-30 ENCOUNTER — Other Ambulatory Visit: Payer: Self-pay | Admitting: Internal Medicine

## 2019-09-30 DIAGNOSIS — Z1231 Encounter for screening mammogram for malignant neoplasm of breast: Secondary | ICD-10-CM

## 2019-11-07 ENCOUNTER — Ambulatory Visit
Admission: RE | Admit: 2019-11-07 | Discharge: 2019-11-07 | Disposition: A | Discharge status: Home / Self Care | Payer: Medicare Other | Source: Ambulatory Visit | Attending: Internal Medicine | Admitting: Internal Medicine

## 2019-11-07 ENCOUNTER — Other Ambulatory Visit: Payer: Self-pay

## 2019-11-07 DIAGNOSIS — Z1231 Encounter for screening mammogram for malignant neoplasm of breast: Secondary | ICD-10-CM

## 2019-11-14 DIAGNOSIS — H40013 Open angle with borderline findings, low risk, bilateral: Secondary | ICD-10-CM | POA: Diagnosis not present

## 2019-11-14 DIAGNOSIS — E119 Type 2 diabetes mellitus without complications: Secondary | ICD-10-CM | POA: Diagnosis not present

## 2019-11-14 DIAGNOSIS — H43811 Vitreous degeneration, right eye: Secondary | ICD-10-CM | POA: Diagnosis not present

## 2019-11-14 DIAGNOSIS — Z961 Presence of intraocular lens: Secondary | ICD-10-CM | POA: Diagnosis not present

## 2019-12-05 DIAGNOSIS — B372 Candidiasis of skin and nail: Secondary | ICD-10-CM | POA: Diagnosis not present

## 2019-12-05 DIAGNOSIS — E1165 Type 2 diabetes mellitus with hyperglycemia: Secondary | ICD-10-CM | POA: Diagnosis not present

## 2019-12-05 DIAGNOSIS — E1142 Type 2 diabetes mellitus with diabetic polyneuropathy: Secondary | ICD-10-CM | POA: Diagnosis not present

## 2019-12-05 DIAGNOSIS — L718 Other rosacea: Secondary | ICD-10-CM | POA: Diagnosis not present

## 2019-12-05 DIAGNOSIS — L659 Nonscarring hair loss, unspecified: Secondary | ICD-10-CM | POA: Diagnosis not present

## 2020-06-01 DIAGNOSIS — E0851 Diabetes mellitus due to underlying condition with diabetic peripheral angiopathy without gangrene: Secondary | ICD-10-CM | POA: Diagnosis not present

## 2020-06-01 DIAGNOSIS — E1142 Type 2 diabetes mellitus with diabetic polyneuropathy: Secondary | ICD-10-CM | POA: Diagnosis not present

## 2020-06-01 DIAGNOSIS — I10 Essential (primary) hypertension: Secondary | ICD-10-CM | POA: Diagnosis not present

## 2020-06-01 DIAGNOSIS — E785 Hyperlipidemia, unspecified: Secondary | ICD-10-CM | POA: Diagnosis not present

## 2020-06-01 DIAGNOSIS — H8112 Benign paroxysmal vertigo, left ear: Secondary | ICD-10-CM | POA: Diagnosis not present

## 2020-06-01 DIAGNOSIS — E559 Vitamin D deficiency, unspecified: Secondary | ICD-10-CM | POA: Diagnosis not present

## 2020-06-01 DIAGNOSIS — E1165 Type 2 diabetes mellitus with hyperglycemia: Secondary | ICD-10-CM | POA: Diagnosis not present

## 2020-06-07 ENCOUNTER — Other Ambulatory Visit: Payer: Self-pay

## 2020-06-07 ENCOUNTER — Ambulatory Visit: Payer: Medicare Other | Attending: Internal Medicine

## 2020-06-07 VITALS — BP 116/65 | HR 75

## 2020-06-07 DIAGNOSIS — H8112 Benign paroxysmal vertigo, left ear: Secondary | ICD-10-CM | POA: Insufficient documentation

## 2020-06-07 DIAGNOSIS — R42 Dizziness and giddiness: Secondary | ICD-10-CM | POA: Insufficient documentation

## 2020-06-07 DIAGNOSIS — R2681 Unsteadiness on feet: Secondary | ICD-10-CM | POA: Insufficient documentation

## 2020-06-07 NOTE — Therapy (Signed)
[facility] [address] [place], [place], [address] Phone: [phone]   Fax:  [phone]  Physical Therapy Evaluation  Patient Details  Name: Gabrielle Craig MRN: [id number] Date of Birth: 05/17/1950 Referring Provider (PT): Harvest Forest, MD   Encounter Date: 06/07/2020   PT End of Session - 06/07/20 1609    Visit Number 1    Number of Visits 5    Date for PT Re-Evaluation 08/06/20   POC for 4 weeks, Cert for 60 days   Authorization Type UHC Medicare + Medicaid Secondary    PT Start Time 1610    PT Stop Time 1653    PT Time Calculation (min) 43 min    Activity Tolerance Patient tolerated treatment well    Behavior During Therapy [facility] for tasks assessed/performed           History reviewed. No pertinent past medical history.  History reviewed. No pertinent surgical history.  Vitals:   06/07/20 1622  BP: 116/65  Pulse: 75      Subjective Assessment - 06/07/20 1612    Subjective Patient developed Vertigo approx three weeks ago upon waking up one morning. States when she stands up she feels the room spinning, and also has associated nausea and vomitting. She is also having having it laying down and rolling onto side. Dizziness is lasted approx few minutes. No falls due to dizziness. Denies change in vision and hearing. Patient reports some pain in the R ear that feels bothersome.    Patient is accompained by: Family member;Interpreter   Training and development officer (interpreter; in person)   Pertinent History DM 2 (Uncontrolled), Vitamin D Deficiency, HLD, HTN, Peripheral Neuropathy    Limitations House hold activities;Walking    Patient Stated Goals Resolve the Vertigo    Currently in Pain? No/denies              [facility] PT Assessment - 06/07/20 0001      Assessment   Medical Diagnosis BPPV    Referring Provider (PT) Harvest Forest, MD    Onset Date/Surgical Date --   aprox 3 weeks ago   Hand Dominance Right       Precautions   Precautions None      Balance Screen   Has the patient fallen in the past 6 months No    Has the patient had a decrease in activity level because of a fear of falling?  No    Is the patient reluctant to leave their home because of a fear of falling?  No      Home Environment   Living Environment Private residence    Additional Comments reports can still complete everything at home, no effect due to dizziness.      Prior Function   Level of Independence Independent      Cognition   Overall Cognitive Status Within Functional Limits for tasks assessed      Observation/Other Assessments   Focus on Therapeutic Outcomes (FOTO)  DPS: 39      Sensation   Light Touch Impaired by gross assessment    Additional Comments History of neuropathy in BLE, and BUE (hand region)      Transfers   Transfers Sit to Stand;Stand to Sit    Sit to Stand 7: Independent    Stand to Sit 7: Independent      Ambulation/Gait   Ambulation/Gait Yes    Ambulation/Gait Assistance 7: Independent    Assistive device None  Gait Pattern Within Functional Limits    Ambulation Surface Level;Indoor             Vestibular Assessment - 06/07/20 0001      Symptom Behavior   Subjective history of current problem see subjective    Type of Dizziness  Spinning;Unsteady with head/body turns;Vertigo;Imbalance;Lightheadedness    Frequency of Dizziness with laying back in bed; sit <> stand; fast movements    Duration of Dizziness < 1 -2 minutes    Symptom Nature Motion provoked;Positional;Intermittent    Aggravating Factors Lying supine;Looking up to the ceiling;Sit to stand;Turning head quickly    Relieving Factors Slow movements;Closing eyes    Progression of Symptoms No change since onset    History of similar episodes No prior history of BPPV      Oculomotor Exam   Oculomotor Alignment Normal    Ocular ROM WNL    Spontaneous Absent    Gaze-induced  Absent    Smooth Pursuits Intact     Saccades Intact      Vestibulo-Ocular Reflex   VOR 1 Head Only (x 1 viewing) difficulty focusing on target; no dizziness/blurry vision reported      Auditory   Comments VBI Screen: Negative      Positional Testing   Dix-Hallpike Dix-Hallpike Left      Dix-Hallpike Left   Dix-Hallpike Left Duration 15 seconds    Dix-Hallpike Left Symptoms Upbeat, left rotatory nystagmus              Objective measurements completed on examination: See above findings.        Vestibular Treatment/Exercise - 06/07/20 0001      Vestibular Treatment/Exercise   Vestibular Treatment Provided Canalith Repositioning    Canalith Repositioning Epley Manuever Left       EPLEY MANUEVER LEFT   Number of Reps  3    Overall Response  Improved Symptoms     RESPONSE DETAILS LEFT no nystagmus, mild dizziness on 3rd rep.                 PT Education - 06/07/20 1609    Education Details Educated on [place]; BPPV    Person(s) Educated Patient    Methods Explanation    Comprehension Verbalized understanding            PT Short Term Goals - 06/07/20 1654      PT SHORT TERM GOAL #1   Title = LTGs             PT Long Term Goals - 06/07/20 1654      PT LONG TERM GOAL #1   Title Patient will improve DPS to >/= 60    Baseline 39    Time 4    Period Weeks    Status New      PT LONG TERM GOAL #2   Title Patient will report no dizziness and have negative positional testing to demo resolution of BPPV    Baseline L BPPV    Time 4    Period Weeks    Status New                  Plan - 06/07/20 1734    Clinical Impression Statement Patient is a 70 y.o. female that was referred to Neuro OPPT services for BPPV/Vertigo. Patient's PMH is significant for the following: DM 2 (Uncontrolled), Vitamin D Deficiency, HLD, HTN, Peripheral Neuropathy. Patient presenting with increased motion senstivity and vertigo, decreased activity tolerance, and imbalance.  Upon assesment  patient presenting with L Posterior Upbeating Nystagmus of Short Duration demonstrating L Posterior Canal Canalithiasis. Treated x 3 reps with improved symptoms noted, no nystagmus on last rep with mild reports of dizziness. Patient will benefit from skilled PT services to address impairments, improve activity tolerance and return to PLOF.    Personal Factors and Comorbidities Comorbidity 3+    Comorbidities DM 2 (Uncontrolled), Vitamin D Deficiency, HLD, HTN, Peripheral Neuropathy    Examination-Activity Limitations Bed Mobility;Bend;Stand    Examination-Participation Restrictions Cleaning    Stability/Clinical Decision Making Evolving/Moderate complexity    Clinical Decision Making Moderate    Rehab Potential Good    PT Frequency 1x / week    PT Duration 4 weeks    PT Treatment/Interventions ADLs/Self Care Home Management;Canalith Repostioning;Stair training;Functional mobility training;Therapeutic activities;Therapeutic exercise;Balance training;Neuromuscular re-education;Patient/family education;Vestibular;Manual techniques    PT Next Visit Plan Reassess L Posterior Canal BPPV. Assess for R BPPV and Horizontal Canal Bilaterally). Educate on home manuever.    Consulted and Agree with Plan of Care Patient;Family member/caregiver    Family Member Consulted Daughter           Patient will benefit from skilled therapeutic intervention in order to improve the following deficits and impairments:  Decreased activity tolerance,Dizziness,Decreased balance,Difficulty walking  Visit Diagnosis: BPPV (benign paroxysmal positional vertigo), left  Dizziness and giddiness  Unsteadiness on feet     Problem List There are no problems to display for this patient.   Tempie Donning, PT, DPT 06/07/2020, 5:41 PM  [facility] [address] [place], [place], [address] Phone: [phone]   Fax:  [phone]  Name: Gabrielle Craig MRN: [id number] Date of Birth: [birth year]

## 2020-06-08 DIAGNOSIS — E1165 Type 2 diabetes mellitus with hyperglycemia: Secondary | ICD-10-CM | POA: Diagnosis not present

## 2020-06-08 DIAGNOSIS — E559 Vitamin D deficiency, unspecified: Secondary | ICD-10-CM | POA: Diagnosis not present

## 2020-06-08 DIAGNOSIS — E785 Hyperlipidemia, unspecified: Secondary | ICD-10-CM | POA: Diagnosis not present

## 2020-06-08 DIAGNOSIS — I10 Essential (primary) hypertension: Secondary | ICD-10-CM | POA: Diagnosis not present

## 2020-06-14 ENCOUNTER — Ambulatory Visit: Payer: Medicare Other | Attending: Internal Medicine

## 2020-06-14 ENCOUNTER — Other Ambulatory Visit: Payer: Self-pay

## 2020-06-14 DIAGNOSIS — R42 Dizziness and giddiness: Secondary | ICD-10-CM | POA: Diagnosis not present

## 2020-06-14 DIAGNOSIS — R2681 Unsteadiness on feet: Secondary | ICD-10-CM | POA: Diagnosis not present

## 2020-06-14 DIAGNOSIS — H8112 Benign paroxysmal vertigo, left ear: Secondary | ICD-10-CM | POA: Diagnosis not present

## 2020-06-14 NOTE — Therapy (Signed)
[facility] [address] [place], [place], [address] Phone: [phone]   Fax:  [phone]  Physical Therapy Treatment/Discharge Summary  Patient Details  Name: Gabrielle Craig MRN: [id number] Date of Birth: 07-14-1950 Referring Provider (PT): Audley Hose, MD  PHYSICAL THERAPY DISCHARGE SUMMARY  Visits from Start of Care: 2  Current functional level related to goals / functional outcomes: See Clinical Impression Statement   Remaining deficits: None   Education / Equipment: Home Maneuver Provided (Spanish Handout)   Plan: Patient agrees to discharge.  Patient goals were met. Patient is being discharged due to meeting the stated rehab goals.  ?????       Encounter Date: 06/14/2020   PT End of Session - 06/14/20 1702    Visit Number 2    Number of Visits 5    Date for PT Re-Evaluation 08/06/20   POC for 4 weeks, Cert for 60 days   Authorization Type UHC Medicare + Medicaid Secondary    PT Start Time 1700    PT Stop Time 1720    PT Time Calculation (min) 20 min    Activity Tolerance Patient tolerated treatment well    Behavior During Therapy [facility] for tasks assessed/performed           No past medical history on file.  No past surgical history on file.  There were no vitals filed for this visit.   Subjective Assessment - 06/14/20 1702    Subjective Patient reports that she has not experienced any of the spinning sensation; but still continues to have some feeling of dizziness but not true vertigo. No new changes/complaints. Patient also reports pain in the ear is gone.    Patient is accompained by: Family member;Interpreter   Administrator, arts (interpreter; in person)   Pertinent History DM 2 (Uncontrolled), Vitamin D Deficiency, HLD, HTN, Peripheral Neuropathy    Limitations House hold activities;Walking    Patient Stated Goals Resolve the Vertigo    Currently in Pain? No/denies              [facility]  PT Assessment - 06/14/20 0001      Observation/Other Assessments   Focus on Therapeutic Outcomes (FOTO)  DFS: 67.4; DPS: 70               Vestibular Assessment - 06/14/20 0001      Positional Testing   Dix-Hallpike Dix-Hallpike Left;Dix-Hallpike Right    Sidelying Test Sidelying Right;Sidelying Left    Horizontal Canal Testing Horizontal Canal Right;Horizontal Canal Left      Dix-Hallpike Right   Dix-Hallpike Right Duration 0    Dix-Hallpike Right Symptoms No nystagmus      Dix-Hallpike Left   Dix-Hallpike Left Duration 0    Dix-Hallpike Left Symptoms No nystagmus      Sidelying Right   Sidelying Right Duration 0    Sidelying Right Symptoms No nystagmus      Sidelying Left   Sidelying Left Duration 0    Sidelying Left Symptoms No nystagmus      Horizontal Canal Right   Horizontal Canal Right Duration 0    Horizontal Canal Right Symptoms Normal      Horizontal Canal Left   Horizontal Canal Left Duration 0    Horizontal Canal Left Symptoms Normal              PT Education - 06/14/20 1721    Education Details Educated on Self Epley Manuever if Reoccurence; If needed return to PT  services with new order    Person(s) Educated Patient;Child(ren)    Methods Explanation;Demonstration;Handout    Comprehension Verbalized understanding;Returned demonstration          Access Code: [email or web address] URL: [email or web address] Date: 06/14/2020 Prepared by: Baldomero Lamy  Exercises Self-Epley Maneuver Left Ear    PT Short Term Goals - 06/07/20 1654      PT SHORT TERM GOAL #1   Title = LTGs             PT Long Term Goals - 06/14/20 1724      PT LONG TERM GOAL #1   Title Patient will improve DPS to >/= 60    Baseline 39; 70 06/14/20    Time 4    Period Weeks    Status Achieved      PT LONG TERM GOAL #2   Title Patient will report no dizziness and have negative positional testing to demo resolution of BPPV    Baseline L BPPV; negative  positional testing and no dizziness on 3/3    Time 4    Period Weeks    Status Achieved                 Plan - 06/14/20 1725    Clinical Impression Statement Completed reassesment of BPPV today, with negative positional testing for all canals and no dizziness reported with bed mobility demonstrating resolution of BPPV. PT educating on self epley manuever if return of symptoms (Demonstration adn handout provided), or new referral to return to PT services. Patient and daughter verbalized understanding. Pt demo readiness for d/c due to resolution of BPPV and symptoms meeting all LTGs during session. Patient has made made excellent progress with PT services.    Personal Factors and Comorbidities Comorbidity 3+    Comorbidities DM 2 (Uncontrolled), Vitamin D Deficiency, HLD, HTN, Peripheral Neuropathy    Examination-Activity Limitations Bed Mobility;Bend;Stand    Examination-Participation Restrictions Cleaning    Stability/Clinical Decision Making Evolving/Moderate complexity    Rehab Potential Good    PT Frequency 1x / week    PT Duration 4 weeks    PT Treatment/Interventions ADLs/Self Care Home Management;Canalith Repostioning;Stair training;Functional mobility training;Therapeutic activities;Therapeutic exercise;Balance training;Neuromuscular re-education;Patient/family education;Vestibular;Manual techniques    Consulted and Agree with Plan of Care Patient;Family member/caregiver    Family Member Consulted Daughter           Patient will benefit from skilled therapeutic intervention in order to improve the following deficits and impairments:  Decreased activity tolerance,Dizziness,Decreased balance,Difficulty walking  Visit Diagnosis: BPPV (benign paroxysmal positional vertigo), left  Dizziness and giddiness  Unsteadiness on feet     Problem List There are no problems to display for this patient.   Jones Bales, PT, DPT 06/14/2020, 5:27 PM  [facility] [address] [place], [place], [address] Phone: [phone]   Fax:  [phone]  Name: Cordie Beazley MRN: [id number] Date of Birth: 1950/06/11

## 2020-06-14 NOTE — Patient Instructions (Signed)
Access Code: [email or web address] URL: [email or web address] Date: 06/14/2020 Prepared by: Jethro Bastos  Exercises Maniobra de autoepley para el o&iacute;do izquierdo

## 2020-06-21 ENCOUNTER — Ambulatory Visit: Payer: Medicare Other

## 2020-06-28 ENCOUNTER — Ambulatory Visit: Payer: Medicare Other

## 2020-07-03 ENCOUNTER — Encounter: Payer: Medicare Other | Admitting: Physical Therapy

## 2020-08-21 DIAGNOSIS — E1142 Type 2 diabetes mellitus with diabetic polyneuropathy: Secondary | ICD-10-CM | POA: Diagnosis not present

## 2020-08-21 DIAGNOSIS — I10 Essential (primary) hypertension: Secondary | ICD-10-CM | POA: Diagnosis not present

## 2020-08-21 DIAGNOSIS — E1165 Type 2 diabetes mellitus with hyperglycemia: Secondary | ICD-10-CM | POA: Diagnosis not present

## 2020-08-21 DIAGNOSIS — M1711 Unilateral primary osteoarthritis, right knee: Secondary | ICD-10-CM | POA: Diagnosis not present

## 2020-08-21 DIAGNOSIS — E785 Hyperlipidemia, unspecified: Secondary | ICD-10-CM | POA: Diagnosis not present

## 2020-08-21 DIAGNOSIS — E0851 Diabetes mellitus due to underlying condition with diabetic peripheral angiopathy without gangrene: Secondary | ICD-10-CM | POA: Diagnosis not present

## 2020-08-27 DIAGNOSIS — M1711 Unilateral primary osteoarthritis, right knee: Secondary | ICD-10-CM | POA: Diagnosis not present

## 2020-08-27 DIAGNOSIS — M545 Low back pain, unspecified: Secondary | ICD-10-CM | POA: Diagnosis not present

## 2020-08-30 DIAGNOSIS — E1165 Type 2 diabetes mellitus with hyperglycemia: Secondary | ICD-10-CM | POA: Diagnosis not present

## 2020-09-24 DIAGNOSIS — M1711 Unilateral primary osteoarthritis, right knee: Secondary | ICD-10-CM | POA: Diagnosis not present

## 2020-09-24 DIAGNOSIS — M7062 Trochanteric bursitis, left hip: Secondary | ICD-10-CM | POA: Diagnosis not present

## 2020-10-09 ENCOUNTER — Other Ambulatory Visit: Payer: Self-pay | Admitting: Internal Medicine

## 2020-10-09 DIAGNOSIS — Z1231 Encounter for screening mammogram for malignant neoplasm of breast: Secondary | ICD-10-CM

## 2020-11-07 DIAGNOSIS — M1711 Unilateral primary osteoarthritis, right knee: Secondary | ICD-10-CM | POA: Diagnosis not present

## 2020-11-16 DIAGNOSIS — E559 Vitamin D deficiency, unspecified: Secondary | ICD-10-CM | POA: Diagnosis not present

## 2020-11-16 DIAGNOSIS — E782 Mixed hyperlipidemia: Secondary | ICD-10-CM | POA: Diagnosis not present

## 2020-11-16 DIAGNOSIS — I10 Essential (primary) hypertension: Secondary | ICD-10-CM | POA: Diagnosis not present

## 2020-11-16 DIAGNOSIS — E785 Hyperlipidemia, unspecified: Secondary | ICD-10-CM | POA: Diagnosis not present

## 2020-11-16 DIAGNOSIS — E1165 Type 2 diabetes mellitus with hyperglycemia: Secondary | ICD-10-CM | POA: Diagnosis not present

## 2020-11-20 DIAGNOSIS — Z01818 Encounter for other preprocedural examination: Secondary | ICD-10-CM | POA: Diagnosis not present

## 2020-11-20 DIAGNOSIS — R8290 Unspecified abnormal findings in urine: Secondary | ICD-10-CM | POA: Diagnosis not present

## 2020-11-20 DIAGNOSIS — Z1211 Encounter for screening for malignant neoplasm of colon: Secondary | ICD-10-CM | POA: Diagnosis not present

## 2020-11-20 DIAGNOSIS — I739 Peripheral vascular disease, unspecified: Secondary | ICD-10-CM | POA: Diagnosis not present

## 2020-11-20 DIAGNOSIS — Z13820 Encounter for screening for osteoporosis: Secondary | ICD-10-CM | POA: Diagnosis not present

## 2020-11-20 DIAGNOSIS — Z0001 Encounter for general adult medical examination with abnormal findings: Secondary | ICD-10-CM | POA: Diagnosis not present

## 2020-11-20 DIAGNOSIS — E1165 Type 2 diabetes mellitus with hyperglycemia: Secondary | ICD-10-CM | POA: Diagnosis not present

## 2020-11-20 DIAGNOSIS — E162 Hypoglycemia, unspecified: Secondary | ICD-10-CM | POA: Diagnosis not present

## 2020-11-20 DIAGNOSIS — F32A Depression, unspecified: Secondary | ICD-10-CM | POA: Diagnosis not present

## 2020-11-20 DIAGNOSIS — Z23 Encounter for immunization: Secondary | ICD-10-CM | POA: Diagnosis not present

## 2020-11-20 DIAGNOSIS — E1142 Type 2 diabetes mellitus with diabetic polyneuropathy: Secondary | ICD-10-CM | POA: Diagnosis not present

## 2020-11-20 DIAGNOSIS — I10 Essential (primary) hypertension: Secondary | ICD-10-CM | POA: Diagnosis not present

## 2020-11-20 DIAGNOSIS — E785 Hyperlipidemia, unspecified: Secondary | ICD-10-CM | POA: Diagnosis not present

## 2020-11-20 DIAGNOSIS — Z1231 Encounter for screening mammogram for malignant neoplasm of breast: Secondary | ICD-10-CM | POA: Diagnosis not present

## 2020-11-21 ENCOUNTER — Other Ambulatory Visit: Payer: Self-pay | Admitting: Internal Medicine

## 2020-11-21 DIAGNOSIS — Z13820 Encounter for screening for osteoporosis: Secondary | ICD-10-CM

## 2020-11-23 DIAGNOSIS — E1165 Type 2 diabetes mellitus with hyperglycemia: Secondary | ICD-10-CM | POA: Diagnosis not present

## 2020-12-03 ENCOUNTER — Other Ambulatory Visit: Payer: Self-pay

## 2020-12-03 ENCOUNTER — Ambulatory Visit
Admission: RE | Admit: 2020-12-03 | Discharge: 2020-12-03 | Disposition: A | Discharge status: Home / Self Care | Payer: Medicare Other | Source: Ambulatory Visit | Attending: Internal Medicine | Admitting: Internal Medicine

## 2020-12-03 DIAGNOSIS — Z1231 Encounter for screening mammogram for malignant neoplasm of breast: Secondary | ICD-10-CM

## 2020-12-18 DIAGNOSIS — E119 Type 2 diabetes mellitus without complications: Secondary | ICD-10-CM | POA: Diagnosis not present

## 2020-12-18 DIAGNOSIS — Z961 Presence of intraocular lens: Secondary | ICD-10-CM | POA: Diagnosis not present

## 2020-12-18 DIAGNOSIS — H43811 Vitreous degeneration, right eye: Secondary | ICD-10-CM | POA: Diagnosis not present

## 2020-12-18 DIAGNOSIS — H40013 Open angle with borderline findings, low risk, bilateral: Secondary | ICD-10-CM | POA: Diagnosis not present

## 2020-12-26 DIAGNOSIS — E1165 Type 2 diabetes mellitus with hyperglycemia: Secondary | ICD-10-CM | POA: Diagnosis not present

## 2021-01-25 DIAGNOSIS — E1165 Type 2 diabetes mellitus with hyperglycemia: Secondary | ICD-10-CM | POA: Diagnosis not present

## 2021-02-19 DIAGNOSIS — E785 Hyperlipidemia, unspecified: Secondary | ICD-10-CM | POA: Diagnosis not present

## 2021-02-19 DIAGNOSIS — I10 Essential (primary) hypertension: Secondary | ICD-10-CM | POA: Diagnosis not present

## 2021-02-19 DIAGNOSIS — E1165 Type 2 diabetes mellitus with hyperglycemia: Secondary | ICD-10-CM | POA: Diagnosis not present

## 2021-02-21 ENCOUNTER — Other Ambulatory Visit: Payer: Self-pay

## 2021-02-21 ENCOUNTER — Ambulatory Visit
Admission: RE | Admit: 2021-02-21 | Discharge: 2021-02-21 | Disposition: A | Discharge status: Home / Self Care | Payer: Medicare Other | Source: Ambulatory Visit | Attending: Internal Medicine | Admitting: Internal Medicine

## 2021-02-21 DIAGNOSIS — Z13820 Encounter for screening for osteoporosis: Secondary | ICD-10-CM

## 2021-02-21 DIAGNOSIS — M8588 Other specified disorders of bone density and structure, other site: Secondary | ICD-10-CM | POA: Diagnosis not present

## 2021-02-21 DIAGNOSIS — Z780 Asymptomatic menopausal state: Secondary | ICD-10-CM | POA: Diagnosis not present

## 2021-02-25 DIAGNOSIS — E1165 Type 2 diabetes mellitus with hyperglycemia: Secondary | ICD-10-CM | POA: Diagnosis not present

## 2021-02-26 DIAGNOSIS — I10 Essential (primary) hypertension: Secondary | ICD-10-CM | POA: Diagnosis not present

## 2021-02-26 DIAGNOSIS — E1142 Type 2 diabetes mellitus with diabetic polyneuropathy: Secondary | ICD-10-CM | POA: Diagnosis not present

## 2021-02-26 DIAGNOSIS — E1165 Type 2 diabetes mellitus with hyperglycemia: Secondary | ICD-10-CM | POA: Diagnosis not present

## 2021-02-26 DIAGNOSIS — E782 Mixed hyperlipidemia: Secondary | ICD-10-CM | POA: Diagnosis not present

## 2021-02-26 DIAGNOSIS — L299 Pruritus, unspecified: Secondary | ICD-10-CM | POA: Diagnosis not present

## 2021-03-13 DIAGNOSIS — E1165 Type 2 diabetes mellitus with hyperglycemia: Secondary | ICD-10-CM | POA: Diagnosis not present

## 2021-03-27 DIAGNOSIS — E1165 Type 2 diabetes mellitus with hyperglycemia: Secondary | ICD-10-CM | POA: Diagnosis not present

## 2021-04-18 DIAGNOSIS — E1142 Type 2 diabetes mellitus with diabetic polyneuropathy: Secondary | ICD-10-CM | POA: Diagnosis not present

## 2021-04-18 DIAGNOSIS — E1165 Type 2 diabetes mellitus with hyperglycemia: Secondary | ICD-10-CM | POA: Diagnosis not present

## 2021-04-18 DIAGNOSIS — E785 Hyperlipidemia, unspecified: Secondary | ICD-10-CM | POA: Diagnosis not present

## 2021-04-18 DIAGNOSIS — I10 Essential (primary) hypertension: Secondary | ICD-10-CM | POA: Diagnosis not present

## 2021-04-27 DIAGNOSIS — E1165 Type 2 diabetes mellitus with hyperglycemia: Secondary | ICD-10-CM | POA: Diagnosis not present

## 2021-05-24 DIAGNOSIS — E1165 Type 2 diabetes mellitus with hyperglycemia: Secondary | ICD-10-CM | POA: Diagnosis not present

## 2021-05-28 DIAGNOSIS — E1165 Type 2 diabetes mellitus with hyperglycemia: Secondary | ICD-10-CM | POA: Diagnosis not present

## 2021-06-25 DIAGNOSIS — E1165 Type 2 diabetes mellitus with hyperglycemia: Secondary | ICD-10-CM | POA: Diagnosis not present

## 2021-07-03 DIAGNOSIS — E785 Hyperlipidemia, unspecified: Secondary | ICD-10-CM | POA: Diagnosis not present

## 2021-07-03 DIAGNOSIS — E1165 Type 2 diabetes mellitus with hyperglycemia: Secondary | ICD-10-CM | POA: Diagnosis not present

## 2021-07-03 DIAGNOSIS — I10 Essential (primary) hypertension: Secondary | ICD-10-CM | POA: Diagnosis not present

## 2021-07-03 DIAGNOSIS — Z742 Need for assistance at home and no other household member able to render care: Secondary | ICD-10-CM | POA: Diagnosis not present

## 2021-07-03 DIAGNOSIS — E1142 Type 2 diabetes mellitus with diabetic polyneuropathy: Secondary | ICD-10-CM | POA: Diagnosis not present

## 2021-07-26 DIAGNOSIS — E1165 Type 2 diabetes mellitus with hyperglycemia: Secondary | ICD-10-CM | POA: Diagnosis not present

## 2021-08-12 DIAGNOSIS — E1142 Type 2 diabetes mellitus with diabetic polyneuropathy: Secondary | ICD-10-CM | POA: Diagnosis not present

## 2021-08-12 DIAGNOSIS — E1165 Type 2 diabetes mellitus with hyperglycemia: Secondary | ICD-10-CM | POA: Diagnosis not present

## 2021-08-12 DIAGNOSIS — I10 Essential (primary) hypertension: Secondary | ICD-10-CM | POA: Diagnosis not present

## 2021-08-25 DIAGNOSIS — E1165 Type 2 diabetes mellitus with hyperglycemia: Secondary | ICD-10-CM | POA: Diagnosis not present

## 2021-09-25 DIAGNOSIS — E1165 Type 2 diabetes mellitus with hyperglycemia: Secondary | ICD-10-CM | POA: Diagnosis not present

## 2021-10-25 DIAGNOSIS — E1165 Type 2 diabetes mellitus with hyperglycemia: Secondary | ICD-10-CM | POA: Diagnosis not present

## 2021-11-04 ENCOUNTER — Other Ambulatory Visit: Payer: Self-pay | Admitting: Internal Medicine

## 2021-11-04 DIAGNOSIS — Z1231 Encounter for screening mammogram for malignant neoplasm of breast: Secondary | ICD-10-CM

## 2021-11-19 DIAGNOSIS — Z1329 Encounter for screening for other suspected endocrine disorder: Secondary | ICD-10-CM | POA: Diagnosis not present

## 2021-11-19 DIAGNOSIS — E1165 Type 2 diabetes mellitus with hyperglycemia: Secondary | ICD-10-CM | POA: Diagnosis not present

## 2021-11-19 DIAGNOSIS — Z0001 Encounter for general adult medical examination with abnormal findings: Secondary | ICD-10-CM | POA: Diagnosis not present

## 2021-11-19 DIAGNOSIS — E785 Hyperlipidemia, unspecified: Secondary | ICD-10-CM | POA: Diagnosis not present

## 2021-11-25 DIAGNOSIS — E1165 Type 2 diabetes mellitus with hyperglycemia: Secondary | ICD-10-CM | POA: Diagnosis not present

## 2021-11-28 DIAGNOSIS — Z0001 Encounter for general adult medical examination with abnormal findings: Secondary | ICD-10-CM | POA: Diagnosis not present

## 2021-11-28 DIAGNOSIS — Z1211 Encounter for screening for malignant neoplasm of colon: Secondary | ICD-10-CM | POA: Diagnosis not present

## 2021-11-28 DIAGNOSIS — Z23 Encounter for immunization: Secondary | ICD-10-CM | POA: Diagnosis not present

## 2021-11-28 DIAGNOSIS — Z638 Other specified problems related to primary support group: Secondary | ICD-10-CM | POA: Diagnosis not present

## 2021-11-28 DIAGNOSIS — E785 Hyperlipidemia, unspecified: Secondary | ICD-10-CM | POA: Diagnosis not present

## 2021-11-28 DIAGNOSIS — R8290 Unspecified abnormal findings in urine: Secondary | ICD-10-CM | POA: Diagnosis not present

## 2021-11-28 DIAGNOSIS — E1165 Type 2 diabetes mellitus with hyperglycemia: Secondary | ICD-10-CM | POA: Diagnosis not present

## 2021-11-28 DIAGNOSIS — E1142 Type 2 diabetes mellitus with diabetic polyneuropathy: Secondary | ICD-10-CM | POA: Diagnosis not present

## 2021-11-28 DIAGNOSIS — Z13820 Encounter for screening for osteoporosis: Secondary | ICD-10-CM | POA: Diagnosis not present

## 2021-11-28 DIAGNOSIS — Z1231 Encounter for screening mammogram for malignant neoplasm of breast: Secondary | ICD-10-CM | POA: Diagnosis not present

## 2021-11-28 DIAGNOSIS — I739 Peripheral vascular disease, unspecified: Secondary | ICD-10-CM | POA: Diagnosis not present

## 2021-11-29 ENCOUNTER — Other Ambulatory Visit: Payer: Self-pay | Admitting: Internal Medicine

## 2021-11-29 DIAGNOSIS — Z13820 Encounter for screening for osteoporosis: Secondary | ICD-10-CM

## 2021-11-29 DIAGNOSIS — K7689 Other specified diseases of liver: Secondary | ICD-10-CM

## 2021-12-17 ENCOUNTER — Inpatient Hospital Stay: Admission: RE | Admit: 2021-12-17 | Payer: Medicare Other | Source: Ambulatory Visit

## 2021-12-19 DIAGNOSIS — E119 Type 2 diabetes mellitus without complications: Secondary | ICD-10-CM | POA: Diagnosis not present

## 2021-12-19 DIAGNOSIS — H26492 Other secondary cataract, left eye: Secondary | ICD-10-CM | POA: Diagnosis not present

## 2021-12-19 DIAGNOSIS — Z961 Presence of intraocular lens: Secondary | ICD-10-CM | POA: Diagnosis not present

## 2021-12-19 DIAGNOSIS — H40013 Open angle with borderline findings, low risk, bilateral: Secondary | ICD-10-CM | POA: Diagnosis not present

## 2021-12-26 DIAGNOSIS — E1165 Type 2 diabetes mellitus with hyperglycemia: Secondary | ICD-10-CM | POA: Diagnosis not present

## 2022-01-08 ENCOUNTER — Ambulatory Visit
Admission: RE | Admit: 2022-01-08 | Discharge: 2022-01-08 | Disposition: A | Discharge status: Home / Self Care | Payer: Medicare Other | Source: Ambulatory Visit | Attending: Internal Medicine | Admitting: Internal Medicine

## 2022-01-08 DIAGNOSIS — Z1231 Encounter for screening mammogram for malignant neoplasm of breast: Secondary | ICD-10-CM | POA: Diagnosis not present

## 2022-01-25 DIAGNOSIS — E1165 Type 2 diabetes mellitus with hyperglycemia: Secondary | ICD-10-CM | POA: Diagnosis not present

## 2022-02-04 DIAGNOSIS — E1165 Type 2 diabetes mellitus with hyperglycemia: Secondary | ICD-10-CM | POA: Diagnosis not present

## 2022-02-04 DIAGNOSIS — K7689 Other specified diseases of liver: Secondary | ICD-10-CM | POA: Diagnosis not present

## 2022-02-04 DIAGNOSIS — B372 Candidiasis of skin and nail: Secondary | ICD-10-CM | POA: Diagnosis not present

## 2022-02-04 DIAGNOSIS — E785 Hyperlipidemia, unspecified: Secondary | ICD-10-CM | POA: Diagnosis not present

## 2022-02-04 DIAGNOSIS — I10 Essential (primary) hypertension: Secondary | ICD-10-CM | POA: Diagnosis not present

## 2022-02-04 DIAGNOSIS — E1142 Type 2 diabetes mellitus with diabetic polyneuropathy: Secondary | ICD-10-CM | POA: Diagnosis not present

## 2022-02-05 ENCOUNTER — Other Ambulatory Visit: Payer: Self-pay | Admitting: Internal Medicine

## 2022-02-12 ENCOUNTER — Other Ambulatory Visit: Payer: Self-pay | Admitting: Internal Medicine

## 2022-02-12 DIAGNOSIS — K7689 Other specified diseases of liver: Secondary | ICD-10-CM

## 2022-02-25 DIAGNOSIS — E1165 Type 2 diabetes mellitus with hyperglycemia: Secondary | ICD-10-CM | POA: Diagnosis not present

## 2022-03-05 ENCOUNTER — Other Ambulatory Visit: Payer: Medicare Other

## 2022-03-27 DIAGNOSIS — E1165 Type 2 diabetes mellitus with hyperglycemia: Secondary | ICD-10-CM | POA: Diagnosis not present

## 2022-05-05 IMAGING — MG MM DIGITAL SCREENING BILAT W/ TOMO AND CAD
8 series · 8 of 24 positions shown · non-contrast
Comparison: Previous exam(s).

CLINICAL DATA: Screening.

EXAM:
DIGITAL SCREENING BILATERAL MAMMOGRAM WITH TOMOSYNTHESIS AND CAD
TECHNIQUE: Bilateral screening digital craniocaudal and mediolateral oblique
mammograms were obtained. Bilateral screening digital breast
tomosynthesis was performed. The images were evaluated with
computer-aided detection.

[R MLO synth-2D]
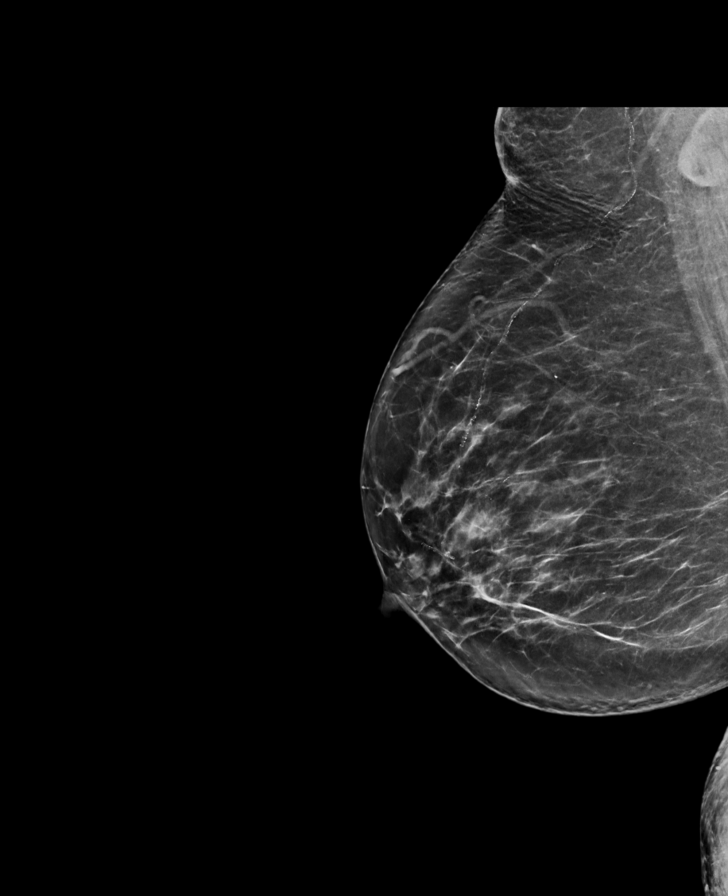

[R CC synth-2D]
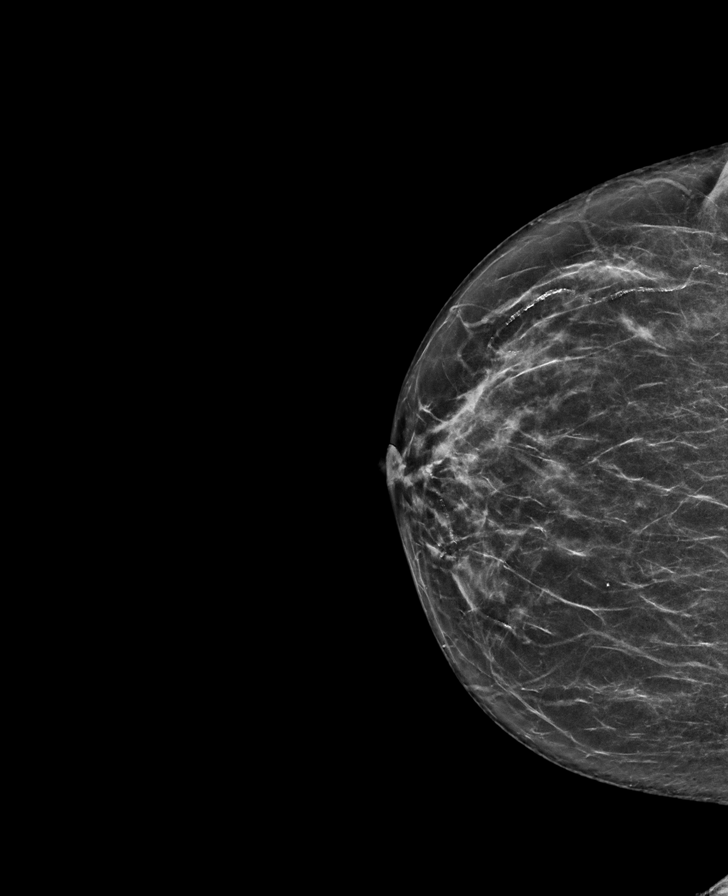

[L MLO synth-2D]
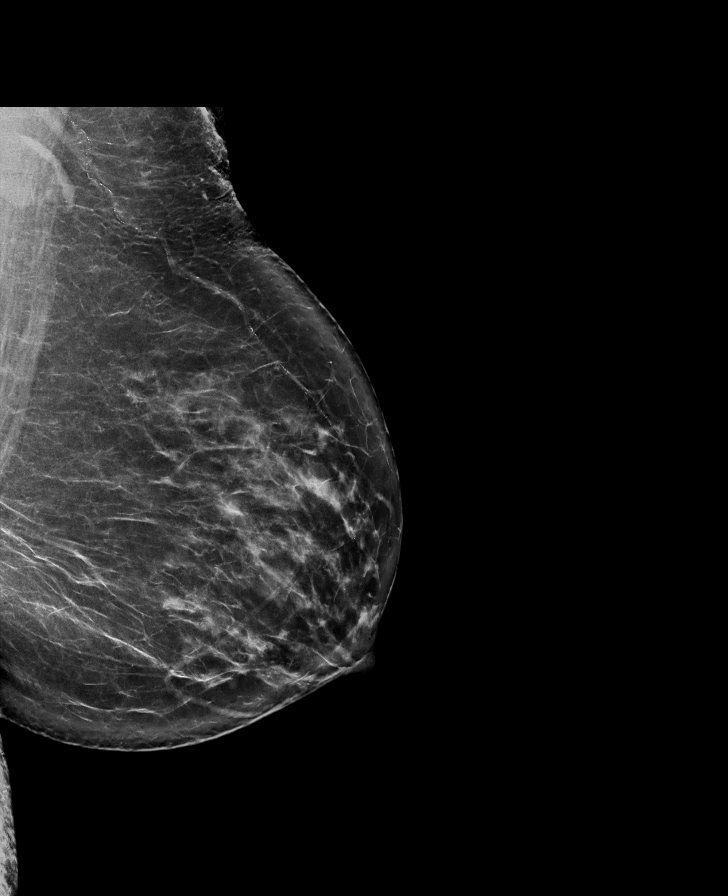

[L CC synth-2D]
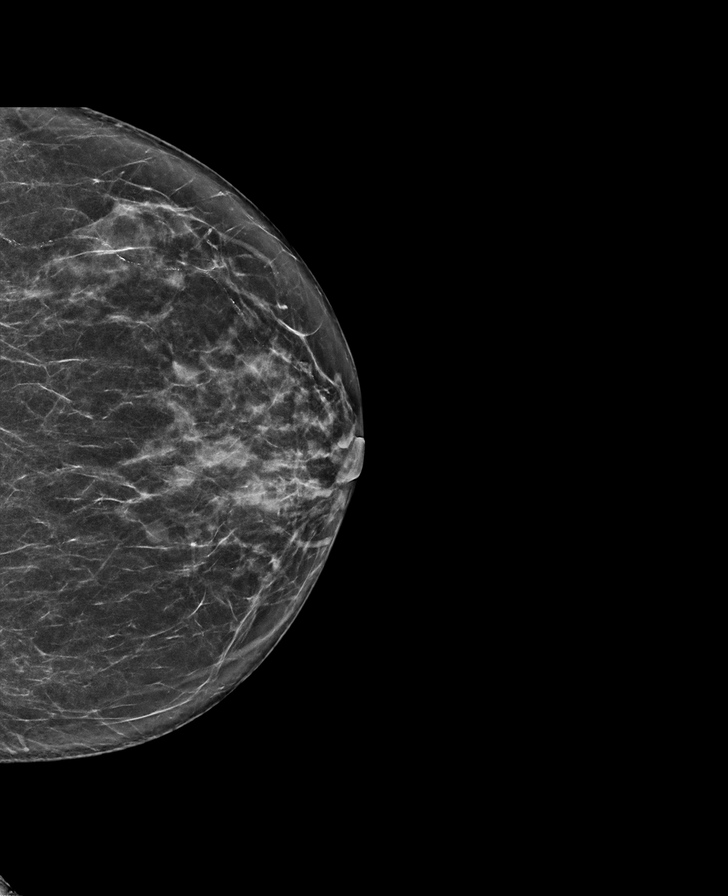

[R MLO tomo · tomo slice 41/81.0]
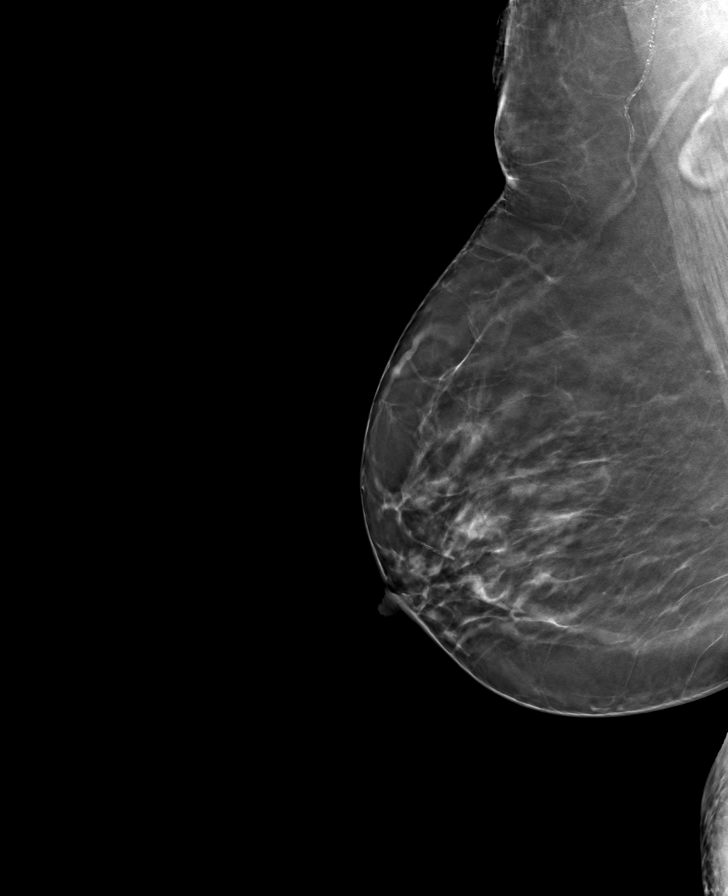

[L MLO tomo · tomo slice 45/88.0]
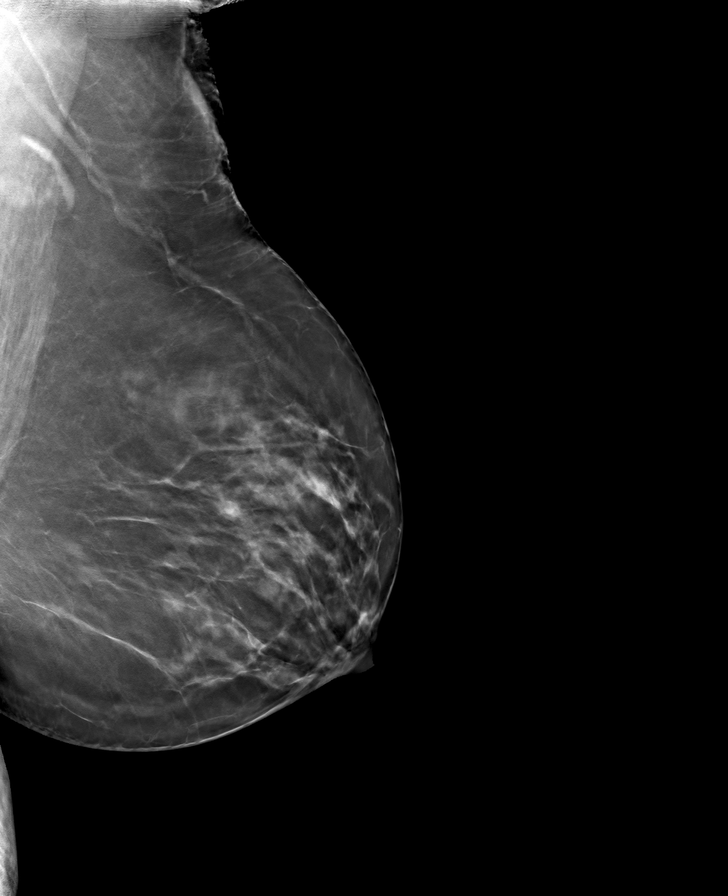

[L CC tomo · tomo slice 34/67.0]
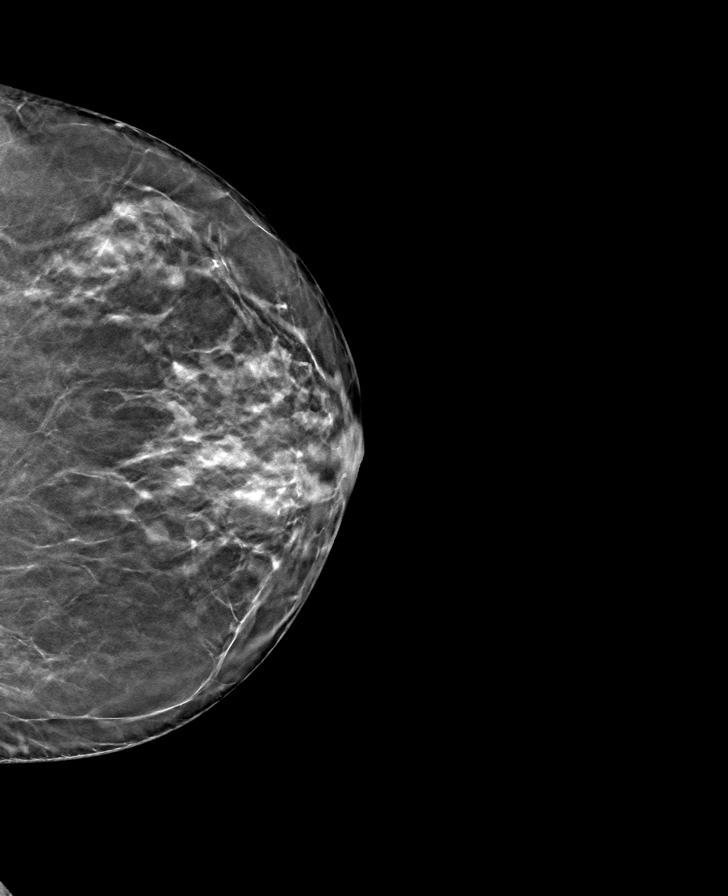

[R CC tomo · tomo slice 33/65.0]
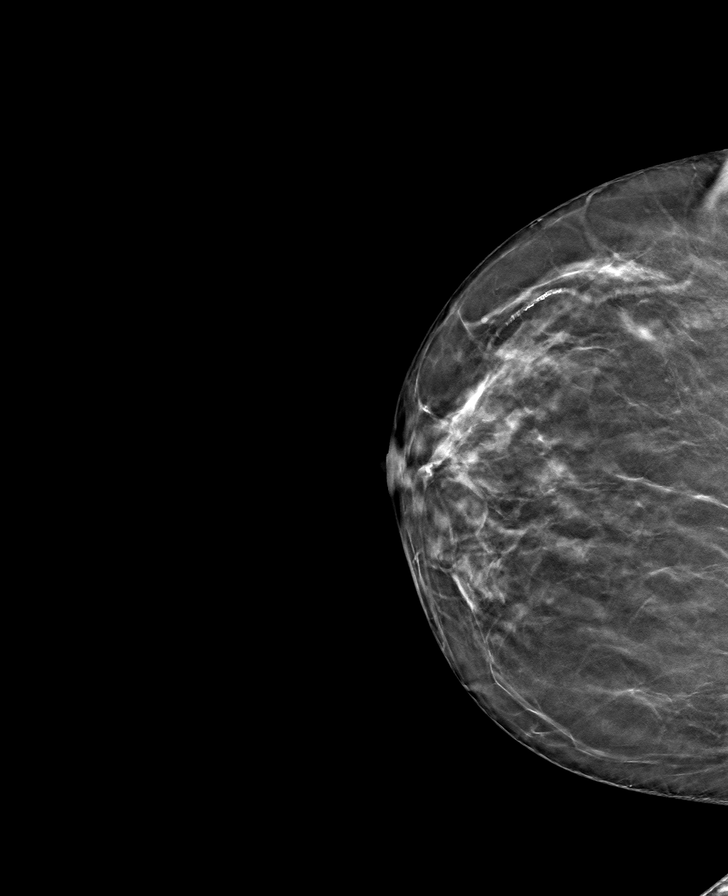

[8 of 24 positions shown; findings below may reference images not displayed]

ACR Breast Density Category b: There are scattered areas of
fibroglandular density.
FINDINGS: There are no findings suspicious for malignancy.
IMPRESSION: No mammographic evidence of malignancy. A result letter of this
screening mammogram will be mailed directly to the patient.

RECOMMENDATION:
Screening mammogram in one year. (Code:51-O-LD2)

BI-RADS CATEGORY  1: Negative.

## 2022-05-20 DIAGNOSIS — E559 Vitamin D deficiency, unspecified: Secondary | ICD-10-CM | POA: Diagnosis not present

## 2022-05-20 DIAGNOSIS — F32A Depression, unspecified: Secondary | ICD-10-CM | POA: Diagnosis not present

## 2022-05-20 DIAGNOSIS — E785 Hyperlipidemia, unspecified: Secondary | ICD-10-CM | POA: Diagnosis not present

## 2022-05-20 DIAGNOSIS — B372 Candidiasis of skin and nail: Secondary | ICD-10-CM | POA: Diagnosis not present

## 2022-05-20 DIAGNOSIS — K7689 Other specified diseases of liver: Secondary | ICD-10-CM | POA: Diagnosis not present

## 2022-05-20 DIAGNOSIS — E1165 Type 2 diabetes mellitus with hyperglycemia: Secondary | ICD-10-CM | POA: Diagnosis not present

## 2022-05-20 DIAGNOSIS — I10 Essential (primary) hypertension: Secondary | ICD-10-CM | POA: Diagnosis not present

## 2022-05-20 DIAGNOSIS — E1142 Type 2 diabetes mellitus with diabetic polyneuropathy: Secondary | ICD-10-CM | POA: Diagnosis not present

## 2022-05-21 ENCOUNTER — Other Ambulatory Visit: Payer: Self-pay | Admitting: Internal Medicine

## 2022-05-21 DIAGNOSIS — R945 Abnormal results of liver function studies: Secondary | ICD-10-CM

## 2022-05-28 DIAGNOSIS — E1165 Type 2 diabetes mellitus with hyperglycemia: Secondary | ICD-10-CM | POA: Diagnosis not present

## 2022-06-05 ENCOUNTER — Ambulatory Visit
Admission: RE | Admit: 2022-06-05 | Discharge: 2022-06-05 | Disposition: A | Discharge status: Home / Self Care | Payer: 59 | Source: Ambulatory Visit | Attending: Internal Medicine | Admitting: Internal Medicine

## 2022-06-05 DIAGNOSIS — R945 Abnormal results of liver function studies: Secondary | ICD-10-CM

## 2022-06-05 DIAGNOSIS — K760 Fatty (change of) liver, not elsewhere classified: Secondary | ICD-10-CM | POA: Diagnosis not present

## 2022-06-06 ENCOUNTER — Other Ambulatory Visit: Payer: Self-pay | Admitting: Internal Medicine

## 2022-06-06 DIAGNOSIS — K8050 Calculus of bile duct without cholangitis or cholecystitis without obstruction: Secondary | ICD-10-CM

## 2022-06-06 DIAGNOSIS — R160 Hepatomegaly, not elsewhere classified: Secondary | ICD-10-CM

## 2022-06-16 DIAGNOSIS — E11649 Type 2 diabetes mellitus with hypoglycemia without coma: Secondary | ICD-10-CM | POA: Diagnosis not present

## 2022-06-16 DIAGNOSIS — K7689 Other specified diseases of liver: Secondary | ICD-10-CM | POA: Diagnosis not present

## 2022-06-16 DIAGNOSIS — E1165 Type 2 diabetes mellitus with hyperglycemia: Secondary | ICD-10-CM | POA: Diagnosis not present

## 2022-06-27 DIAGNOSIS — E1165 Type 2 diabetes mellitus with hyperglycemia: Secondary | ICD-10-CM | POA: Diagnosis not present

## 2022-06-28 ENCOUNTER — Ambulatory Visit
Admission: RE | Admit: 2022-06-28 | Discharge: 2022-06-28 | Disposition: A | Discharge status: Home / Self Care | Payer: 59 | Source: Ambulatory Visit | Attending: Internal Medicine | Admitting: Internal Medicine

## 2022-06-28 DIAGNOSIS — K8050 Calculus of bile duct without cholangitis or cholecystitis without obstruction: Secondary | ICD-10-CM

## 2022-06-28 DIAGNOSIS — R160 Hepatomegaly, not elsewhere classified: Secondary | ICD-10-CM

## 2022-06-28 DIAGNOSIS — K760 Fatty (change of) liver, not elsewhere classified: Secondary | ICD-10-CM | POA: Diagnosis not present

## 2022-06-28 DIAGNOSIS — K5710 Diverticulosis of small intestine without perforation or abscess without bleeding: Secondary | ICD-10-CM | POA: Diagnosis not present

## 2022-06-28 MED ORDER — GADOPICLENOL 0.5 MMOL/ML IV SOLN
7.5000 mL | Freq: Once | INTRAVENOUS | Status: AC | PRN
  Administered 2022-06-28: 7.5 mL via INTRAVENOUS

## 2022-07-01 DIAGNOSIS — B3731 Acute candidiasis of vulva and vagina: Secondary | ICD-10-CM | POA: Diagnosis not present

## 2022-07-01 DIAGNOSIS — K760 Fatty (change of) liver, not elsewhere classified: Secondary | ICD-10-CM | POA: Diagnosis not present

## 2022-07-01 DIAGNOSIS — E1165 Type 2 diabetes mellitus with hyperglycemia: Secondary | ICD-10-CM | POA: Diagnosis not present

## 2022-07-01 DIAGNOSIS — K7689 Other specified diseases of liver: Secondary | ICD-10-CM | POA: Diagnosis not present

## 2022-07-22 DIAGNOSIS — R0781 Pleurodynia: Secondary | ICD-10-CM | POA: Diagnosis not present

## 2022-07-22 DIAGNOSIS — R030 Elevated blood-pressure reading, without diagnosis of hypertension: Secondary | ICD-10-CM | POA: Diagnosis not present

## 2022-07-22 DIAGNOSIS — M546 Pain in thoracic spine: Secondary | ICD-10-CM | POA: Diagnosis not present

## 2022-07-22 DIAGNOSIS — R059 Cough, unspecified: Secondary | ICD-10-CM | POA: Diagnosis not present

## 2022-07-22 DIAGNOSIS — J209 Acute bronchitis, unspecified: Secondary | ICD-10-CM | POA: Diagnosis not present

## 2022-07-23 ENCOUNTER — Ambulatory Visit
Admission: RE | Admit: 2022-07-23 | Discharge: 2022-07-23 | Disposition: A | Discharge status: Home / Self Care | Payer: 59 | Source: Ambulatory Visit | Attending: Family Medicine | Admitting: Family Medicine

## 2022-07-23 ENCOUNTER — Other Ambulatory Visit: Payer: Self-pay | Admitting: Family Medicine

## 2022-07-23 DIAGNOSIS — R059 Cough, unspecified: Secondary | ICD-10-CM | POA: Diagnosis not present

## 2022-07-23 DIAGNOSIS — J209 Acute bronchitis, unspecified: Secondary | ICD-10-CM

## 2022-07-27 DIAGNOSIS — E1165 Type 2 diabetes mellitus with hyperglycemia: Secondary | ICD-10-CM | POA: Diagnosis not present

## 2022-08-05 DIAGNOSIS — E1165 Type 2 diabetes mellitus with hyperglycemia: Secondary | ICD-10-CM | POA: Diagnosis not present

## 2022-08-05 DIAGNOSIS — E785 Hyperlipidemia, unspecified: Secondary | ICD-10-CM | POA: Diagnosis not present

## 2022-08-05 DIAGNOSIS — E1142 Type 2 diabetes mellitus with diabetic polyneuropathy: Secondary | ICD-10-CM | POA: Diagnosis not present

## 2022-08-05 DIAGNOSIS — R39198 Other difficulties with micturition: Secondary | ICD-10-CM | POA: Diagnosis not present

## 2022-09-10 DIAGNOSIS — E1165 Type 2 diabetes mellitus with hyperglycemia: Secondary | ICD-10-CM | POA: Diagnosis not present

## 2022-09-10 DIAGNOSIS — L819 Disorder of pigmentation, unspecified: Secondary | ICD-10-CM | POA: Diagnosis not present

## 2022-10-29 ENCOUNTER — Other Ambulatory Visit: Payer: Self-pay | Admitting: Internal Medicine

## 2022-10-29 DIAGNOSIS — Z1231 Encounter for screening mammogram for malignant neoplasm of breast: Secondary | ICD-10-CM

## 2022-10-29 DIAGNOSIS — E1165 Type 2 diabetes mellitus with hyperglycemia: Secondary | ICD-10-CM | POA: Diagnosis not present

## 2022-12-04 DIAGNOSIS — E1165 Type 2 diabetes mellitus with hyperglycemia: Secondary | ICD-10-CM | POA: Diagnosis not present

## 2022-12-04 DIAGNOSIS — E785 Hyperlipidemia, unspecified: Secondary | ICD-10-CM | POA: Diagnosis not present

## 2022-12-04 DIAGNOSIS — K7689 Other specified diseases of liver: Secondary | ICD-10-CM | POA: Diagnosis not present

## 2022-12-04 DIAGNOSIS — E559 Vitamin D deficiency, unspecified: Secondary | ICD-10-CM | POA: Diagnosis not present

## 2022-12-09 DIAGNOSIS — I739 Peripheral vascular disease, unspecified: Secondary | ICD-10-CM | POA: Diagnosis not present

## 2022-12-09 DIAGNOSIS — E1165 Type 2 diabetes mellitus with hyperglycemia: Secondary | ICD-10-CM | POA: Diagnosis not present

## 2022-12-09 DIAGNOSIS — I10 Essential (primary) hypertension: Secondary | ICD-10-CM | POA: Diagnosis not present

## 2022-12-09 DIAGNOSIS — E1142 Type 2 diabetes mellitus with diabetic polyneuropathy: Secondary | ICD-10-CM | POA: Diagnosis not present

## 2022-12-22 DIAGNOSIS — H02831 Dermatochalasis of right upper eyelid: Secondary | ICD-10-CM | POA: Diagnosis not present

## 2022-12-22 DIAGNOSIS — Z961 Presence of intraocular lens: Secondary | ICD-10-CM | POA: Diagnosis not present

## 2022-12-22 DIAGNOSIS — E119 Type 2 diabetes mellitus without complications: Secondary | ICD-10-CM | POA: Diagnosis not present

## 2022-12-22 DIAGNOSIS — H40013 Open angle with borderline findings, low risk, bilateral: Secondary | ICD-10-CM | POA: Diagnosis not present

## 2023-01-12 ENCOUNTER — Ambulatory Visit: Payer: 59

## 2023-01-17 ENCOUNTER — Ambulatory Visit
Admission: RE | Admit: 2023-01-17 | Discharge: 2023-01-17 | Disposition: A | Discharge status: Home / Self Care | Payer: 59 | Source: Ambulatory Visit | Attending: Internal Medicine | Admitting: Internal Medicine

## 2023-01-17 DIAGNOSIS — Z1231 Encounter for screening mammogram for malignant neoplasm of breast: Secondary | ICD-10-CM

## 2023-01-29 ENCOUNTER — Ambulatory Visit: Payer: 59 | Admitting: Obstetrics and Gynecology

## 2023-02-04 DIAGNOSIS — I10 Essential (primary) hypertension: Secondary | ICD-10-CM | POA: Diagnosis not present

## 2023-02-04 DIAGNOSIS — E559 Vitamin D deficiency, unspecified: Secondary | ICD-10-CM | POA: Diagnosis not present

## 2023-02-04 DIAGNOSIS — E1165 Type 2 diabetes mellitus with hyperglycemia: Secondary | ICD-10-CM | POA: Diagnosis not present

## 2023-02-04 DIAGNOSIS — S99921A Unspecified injury of right foot, initial encounter: Secondary | ICD-10-CM | POA: Diagnosis not present

## 2023-02-04 DIAGNOSIS — Z23 Encounter for immunization: Secondary | ICD-10-CM | POA: Diagnosis not present

## 2023-02-04 DIAGNOSIS — E785 Hyperlipidemia, unspecified: Secondary | ICD-10-CM | POA: Diagnosis not present

## 2023-02-11 ENCOUNTER — Other Ambulatory Visit: Payer: Self-pay | Admitting: Internal Medicine

## 2023-02-11 ENCOUNTER — Ambulatory Visit
Admission: RE | Admit: 2023-02-11 | Discharge: 2023-02-11 | Disposition: A | Discharge status: Home / Self Care | Payer: 59 | Source: Ambulatory Visit | Attending: Internal Medicine | Admitting: Internal Medicine

## 2023-02-11 DIAGNOSIS — S92421A Displaced fracture of distal phalanx of right great toe, initial encounter for closed fracture: Secondary | ICD-10-CM | POA: Diagnosis not present

## 2023-02-11 DIAGNOSIS — M79674 Pain in right toe(s): Secondary | ICD-10-CM | POA: Diagnosis not present

## 2023-02-11 DIAGNOSIS — S99921A Unspecified injury of right foot, initial encounter: Secondary | ICD-10-CM

## 2023-03-02 ENCOUNTER — Ambulatory Visit: Payer: 59 | Admitting: Obstetrics and Gynecology

## 2023-03-03 DIAGNOSIS — I10 Essential (primary) hypertension: Secondary | ICD-10-CM | POA: Diagnosis not present

## 2023-03-03 DIAGNOSIS — E785 Hyperlipidemia, unspecified: Secondary | ICD-10-CM | POA: Diagnosis not present

## 2023-03-03 DIAGNOSIS — E1165 Type 2 diabetes mellitus with hyperglycemia: Secondary | ICD-10-CM | POA: Diagnosis not present

## 2023-03-03 DIAGNOSIS — E559 Vitamin D deficiency, unspecified: Secondary | ICD-10-CM | POA: Diagnosis not present

## 2023-03-10 DIAGNOSIS — R112 Nausea with vomiting, unspecified: Secondary | ICD-10-CM | POA: Diagnosis not present

## 2023-03-10 DIAGNOSIS — Z13820 Encounter for screening for osteoporosis: Secondary | ICD-10-CM | POA: Diagnosis not present

## 2023-03-10 DIAGNOSIS — Z1231 Encounter for screening mammogram for malignant neoplasm of breast: Secondary | ICD-10-CM | POA: Diagnosis not present

## 2023-03-10 DIAGNOSIS — Z0000 Encounter for general adult medical examination without abnormal findings: Secondary | ICD-10-CM | POA: Diagnosis not present

## 2023-03-10 DIAGNOSIS — E1165 Type 2 diabetes mellitus with hyperglycemia: Secondary | ICD-10-CM | POA: Diagnosis not present

## 2023-03-10 DIAGNOSIS — Z23 Encounter for immunization: Secondary | ICD-10-CM | POA: Diagnosis not present

## 2023-03-10 DIAGNOSIS — Z1211 Encounter for screening for malignant neoplasm of colon: Secondary | ICD-10-CM | POA: Diagnosis not present

## 2023-03-10 DIAGNOSIS — E559 Vitamin D deficiency, unspecified: Secondary | ICD-10-CM | POA: Diagnosis not present

## 2023-03-10 DIAGNOSIS — Z0001 Encounter for general adult medical examination with abnormal findings: Secondary | ICD-10-CM | POA: Diagnosis not present

## 2023-03-10 DIAGNOSIS — I10 Essential (primary) hypertension: Secondary | ICD-10-CM | POA: Diagnosis not present

## 2023-03-11 ENCOUNTER — Other Ambulatory Visit: Payer: Self-pay | Admitting: Internal Medicine

## 2023-03-11 DIAGNOSIS — Z13820 Encounter for screening for osteoporosis: Secondary | ICD-10-CM

## 2023-03-26 ENCOUNTER — Ambulatory Visit (INDEPENDENT_AMBULATORY_CARE_PROVIDER_SITE_OTHER): Payer: 59 | Admitting: Podiatry

## 2023-03-26 ENCOUNTER — Ambulatory Visit: Payer: 59

## 2023-03-26 DIAGNOSIS — S92421A Displaced fracture of distal phalanx of right great toe, initial encounter for closed fracture: Secondary | ICD-10-CM | POA: Diagnosis not present

## 2023-03-27 DIAGNOSIS — E1165 Type 2 diabetes mellitus with hyperglycemia: Secondary | ICD-10-CM | POA: Diagnosis not present

## 2023-03-27 DIAGNOSIS — E1142 Type 2 diabetes mellitus with diabetic polyneuropathy: Secondary | ICD-10-CM | POA: Diagnosis not present

## 2023-03-27 DIAGNOSIS — E7849 Other hyperlipidemia: Secondary | ICD-10-CM | POA: Diagnosis not present

## 2023-03-27 DIAGNOSIS — B372 Candidiasis of skin and nail: Secondary | ICD-10-CM | POA: Diagnosis not present

## 2023-03-27 DIAGNOSIS — Z23 Encounter for immunization: Secondary | ICD-10-CM | POA: Diagnosis not present

## 2023-03-27 DIAGNOSIS — I10 Essential (primary) hypertension: Secondary | ICD-10-CM | POA: Diagnosis not present

## 2023-03-28 NOTE — Progress Notes (Signed)
  Subjective:  Patient ID: Gabrielle Craig, female    DOB: [birth year],  MRN: [id number]  Chief Complaint  Patient presents with   Toe Pain    p    Discussed the use of AI scribe software for clinical note transcription with the patient, who gave verbal consent to proceed.  History of Present Illness   The patient, with a history of a toe fracture, presented for a follow-up visit. The injury occurred in October when the patient accidentally kicked a concrete base while attempting to dispose of trash, resulting in a fall. The primary injury was to the toe, with minor scratches to the elbow and knee. The patient's primary care physician had previously ordered an x-ray, which confirmed the toe fracture.  The patient reported persistent pain in the toe, particularly when wearing certain types of shoes. The pain was also present during certain movements and when pressure was applied to the area. Despite the discomfort, the patient was able to walk with specific types of shoes without significant issues. The patient was planning to travel to [place] for three months, starting in late December.          Objective:    Physical Exam   MUSCULOSKELETAL: Pain at interphalangeal joint and base of proximal phalanx with range of motion and palpation. SKIN: Scratches on elbow and knee.       No images are attached to the encounter.    Results   RADIOLOGY Toe X-ray (03/26/2023): Oblique fracture intraarticular with minimal offset of the joint at the base of the distal phalanx. Increased interval bridging and bone healing across the fracture site compared to previous radiographs. Toe X-ray (02/11/2023): Oblique fracture intraarticular with minimal offset of the joint at the base of the distal phalanx.      Assessment:   1. Closed displaced fracture of distal phalanx of right great toe, initial encounter      Plan:  Patient was evaluated and treated and all questions answered.  Assessment  and Plan    Toe Fracture   She exhibits a painful interphalangeal joint with range of motion and palpation of the base of the proximal phalanx. An X-ray reveals an oblique fracture intraarticular with minimal offset of the joint at the base of the distal phalanx, showing increased interval bridging and bone healing compared to previous radiographs. I recommend non operative treatment at this point. We dispensed a surgical shoe for use during ambulation for protected weight bearing for the next 3-4 weeks. She will follow up as needed after returning from [place] in approximately 3 months.          Return if symptoms worsen or fail to improve.

## 2023-05-06 ENCOUNTER — Ambulatory Visit: Payer: 59 | Admitting: Dermatology

## 2023-07-14 DIAGNOSIS — E1142 Type 2 diabetes mellitus with diabetic polyneuropathy: Secondary | ICD-10-CM | POA: Diagnosis not present

## 2023-07-14 DIAGNOSIS — B372 Candidiasis of skin and nail: Secondary | ICD-10-CM | POA: Diagnosis not present

## 2023-07-14 DIAGNOSIS — M8580 Other specified disorders of bone density and structure, unspecified site: Secondary | ICD-10-CM | POA: Diagnosis not present

## 2023-07-14 DIAGNOSIS — R112 Nausea with vomiting, unspecified: Secondary | ICD-10-CM | POA: Diagnosis not present

## 2023-07-14 DIAGNOSIS — L718 Other rosacea: Secondary | ICD-10-CM | POA: Diagnosis not present

## 2023-07-14 DIAGNOSIS — E7849 Other hyperlipidemia: Secondary | ICD-10-CM | POA: Diagnosis not present

## 2023-07-14 DIAGNOSIS — E1165 Type 2 diabetes mellitus with hyperglycemia: Secondary | ICD-10-CM | POA: Diagnosis not present

## 2023-07-14 DIAGNOSIS — I739 Peripheral vascular disease, unspecified: Secondary | ICD-10-CM | POA: Diagnosis not present

## 2023-07-14 DIAGNOSIS — Z780 Asymptomatic menopausal state: Secondary | ICD-10-CM | POA: Diagnosis not present

## 2023-07-14 DIAGNOSIS — I10 Essential (primary) hypertension: Secondary | ICD-10-CM | POA: Diagnosis not present

## 2023-07-14 DIAGNOSIS — R8290 Unspecified abnormal findings in urine: Secondary | ICD-10-CM | POA: Diagnosis not present

## 2023-07-17 DIAGNOSIS — I10 Essential (primary) hypertension: Secondary | ICD-10-CM | POA: Diagnosis not present

## 2023-07-17 DIAGNOSIS — E559 Vitamin D deficiency, unspecified: Secondary | ICD-10-CM | POA: Diagnosis not present

## 2023-07-17 DIAGNOSIS — E538 Deficiency of other specified B group vitamins: Secondary | ICD-10-CM | POA: Diagnosis not present

## 2023-07-17 DIAGNOSIS — E782 Mixed hyperlipidemia: Secondary | ICD-10-CM | POA: Diagnosis not present

## 2023-07-17 DIAGNOSIS — E1165 Type 2 diabetes mellitus with hyperglycemia: Secondary | ICD-10-CM | POA: Diagnosis not present

## 2023-09-14 DIAGNOSIS — E1165 Type 2 diabetes mellitus with hyperglycemia: Secondary | ICD-10-CM | POA: Diagnosis not present

## 2023-09-14 DIAGNOSIS — I10 Essential (primary) hypertension: Secondary | ICD-10-CM | POA: Diagnosis not present

## 2023-09-14 DIAGNOSIS — R1013 Epigastric pain: Secondary | ICD-10-CM | POA: Diagnosis not present

## 2023-09-14 DIAGNOSIS — E7849 Other hyperlipidemia: Secondary | ICD-10-CM | POA: Diagnosis not present

## 2023-09-14 DIAGNOSIS — E1142 Type 2 diabetes mellitus with diabetic polyneuropathy: Secondary | ICD-10-CM | POA: Diagnosis not present

## 2023-11-04 ENCOUNTER — Other Ambulatory Visit: Payer: 59

## 2023-11-12 ENCOUNTER — Ambulatory Visit: Payer: 59 | Admitting: Dermatology

## 2023-12-07 ENCOUNTER — Other Ambulatory Visit: Payer: Self-pay | Admitting: Internal Medicine

## 2023-12-07 DIAGNOSIS — Z1231 Encounter for screening mammogram for malignant neoplasm of breast: Secondary | ICD-10-CM

## 2023-12-17 ENCOUNTER — Other Ambulatory Visit (HOSPITAL_BASED_OUTPATIENT_CLINIC_OR_DEPARTMENT_OTHER)

## 2023-12-24 ENCOUNTER — Ambulatory Visit: Admitting: Podiatry

## 2023-12-24 VITALS — Ht 61.0 in | Wt 141.0 lb

## 2023-12-24 DIAGNOSIS — S90222A Contusion of left lesser toe(s) with damage to nail, initial encounter: Secondary | ICD-10-CM | POA: Diagnosis not present

## 2023-12-24 NOTE — Patient Instructions (Signed)
Instrucciones de remojo  El dia despues del procedimiento:  Coloque 1/4 taza de sal de epsom en un litro de agua tibia del grifo. Sumerja su pie o pies con el vendaje externo intacto para el remojo inicial; esto permitira que el vendaje se humdezca y humedezca  para despegarlo facilmente. Una ves que retire su vendaje, continue remojando la solucion durante 20 minutos. Este remojo deber hacerse dos veces al dia. Luego, retire su pie o pies de la solucion, seque el area afectada y cubra. Puede usar una curita lo suficientemente grande como para cubrir el area o usar una gasa y cinta adhesive. Aplique otros medicamentos en el area segun las indicaciones del medico, como la polisporina neosporina.    SI SU PIEL SE IRRITA AL USAR ESTAS INSTRUCCIONES, ES ACEPTABLE CAMBIARSE AL VINAGRE BLANCO Y AL AGUA. O puede usar agua y jabon antibacterial para mantener limpio el dedo del pie.   Monitoree cualquier signo/sintoma de infeccion. Llame a la oficina de inmediato si occure o vaya directament a la sal de emergencias. Llame con cualquier pregunta/inquitud.  Instrucciones de cuidado a largo plazco: cirugia post clavo; Le han tratado la una encarnada y la raiz con un quimico. Este quimico causa una quemadura que drenara y supurara como una ampolla. Esto puede drenar durante 6-8 semana o mas. Es importante mantener esta area limpia, cubierta y seguir las intrucciones de remojo distribuidas al momento de la cirugia. Esta area finalmente se secara y formara una costra. Una ves que se forma la costra, ya no necesita remojar o aplicar un aposito. Si en algun momento experimenta un aumento en el dolor, enrojecimiento, hinchazon o drenaje, debe comunicarse con la oficina lo antes posible.   

## 2023-12-24 NOTE — Progress Notes (Signed)
  Subjective:  Patient ID: Gabrielle Craig, female    DOB: 1951-01-17,  MRN: [id number]  Chief Complaint  Patient presents with   Diabetes    RM 4 DFC (A1C8.5)  Patient is here for discolored left index toe nail, discoloration present x month. Pt states no injury to 2nd left toe.    73 y.o. female presents with the above complaint. History confirmed with patient.   Objective:  Physical Exam: warm, good capillary refill, no trophic changes or ulcerative lesions, normal DP and PT pulses, normal sensory exam, and left second toenail has subungual hematoma .  Assessment:   1. Subungual hematoma of foot, left, initial encounter      Plan:  Patient was evaluated and treated and all questions answered.  We discussed appropriate shoe gear and she is developing supple hematoma and damage to the left second nail plate.  I recommended removal of the nail plate.  Following consent and a discussion of the risks and benefits including the risk of infection and nonhealing the left second toe was anesthetized 1.5 cc each of 2% lidocaine and 0.5% Marcaine plain.  It was prepped with Betadine exsanguinated and tourniquet secured around the base of the toe.  A Freer elevator was used to remove the left second nail plate and it was irrigated with alcohol and dressed with Silvadene and a dry sterile compression bandage.  Follow-up with me in 4 weeks to reevaluate healing progress and she will be rescheduled for [employer] in December for this the remaining nails were trimmed in length with a sharp nail nipper today to a tolerable level.  Return in about 4 weeks (around 01/21/2024) for nail re-check.

## 2024-01-18 ENCOUNTER — Ambulatory Visit

## 2024-01-22 ENCOUNTER — Ambulatory Visit
Admission: RE | Admit: 2024-01-22 | Discharge: 2024-01-22 | Disposition: A | Discharge status: Home / Self Care | Source: Ambulatory Visit | Attending: Internal Medicine | Admitting: Internal Medicine

## 2024-01-22 DIAGNOSIS — Z1231 Encounter for screening mammogram for malignant neoplasm of breast: Secondary | ICD-10-CM

## 2024-01-28 ENCOUNTER — Ambulatory Visit (INDEPENDENT_AMBULATORY_CARE_PROVIDER_SITE_OTHER): Admitting: Podiatry

## 2024-01-28 VITALS — Ht 61.0 in | Wt 141.0 lb

## 2024-01-28 DIAGNOSIS — S90222D Contusion of left lesser toe(s) with damage to nail, subsequent encounter: Secondary | ICD-10-CM

## 2024-01-28 NOTE — Progress Notes (Signed)
  Subjective:  Patient ID: Gabrielle Craig, female    DOB: 19-Feb-1951,  MRN: [id number]  Chief Complaint  Patient presents with   Nail Problem    Rm 5 Patient is here to f/u on hematoma of the left 2nd toe. Patient states no pain or discomfort.    73 y.o. female presents with the above complaint. History confirmed with patient.   Objective:  Physical Exam: warm, good capillary refill, no trophic changes or ulcerative lesions, normal DP and PT pulses, normal sensory exam, and nail removal site has healed  Assessment:   1. Subungual hematoma of second toe of left foot, subsequent encounter      Plan:  Patient was evaluated and treated and all questions answered.  Doing much better can discontinue soaks and ointment and bandaging.  Leave open to air and allow to heal secondarily.  Return as needed for this or other issues.  She has at risk diabetic footcare scheduled in December with Dr. Loreda.  No follow-ups on file.

## 2024-03-30 ENCOUNTER — Ambulatory Visit: Admitting: Podiatry
